# Patient Record
Sex: Female | Born: 1985 | Race: Black or African American | Hispanic: No | Smoking: Never smoker
Health system: Southern US, Community
[De-identification: ages and names within clinical notes are randomized; demographics above are authoritative.]

## PROBLEM LIST (undated history)

## (undated) DIAGNOSIS — J45909 Unspecified asthma, uncomplicated: Secondary | ICD-10-CM

## (undated) HISTORY — DX: Unspecified asthma, uncomplicated: J45.909

## (undated) HISTORY — PX: WISDOM TOOTH EXTRACTION: SHX21

---
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
## 2007-09-19 ENCOUNTER — Emergency Department (HOSPITAL_COMMUNITY): Admission: EM | Admit: 2007-09-19 | Discharge: 2007-09-19 | Payer: Self-pay | Admitting: Emergency Medicine

## 2009-01-30 ENCOUNTER — Emergency Department (HOSPITAL_COMMUNITY): Admission: EM | Admit: 2009-01-30 | Discharge: 2009-01-30 | Payer: Self-pay | Admitting: Family Medicine

## 2009-01-30 ENCOUNTER — Emergency Department (HOSPITAL_COMMUNITY): Admission: EM | Admit: 2009-01-30 | Discharge: 2009-01-31 | Payer: Self-pay | Admitting: Emergency Medicine

## 2009-10-05 ENCOUNTER — Emergency Department (HOSPITAL_COMMUNITY): Admission: EM | Admit: 2009-10-05 | Discharge: 2009-10-05 | Payer: Self-pay | Admitting: Family Medicine

## 2009-10-29 ENCOUNTER — Emergency Department (HOSPITAL_COMMUNITY): Admission: EM | Admit: 2009-10-29 | Discharge: 2009-10-30 | Payer: Self-pay | Admitting: Emergency Medicine

## 2010-06-24 LAB — COMPREHENSIVE METABOLIC PANEL
ALT: 35 U/L (ref 0–35)
AST: 34 U/L (ref 0–37)
Alkaline Phosphatase: 52 U/L (ref 39–117)
CO2: 25 mEq/L (ref 19–32)
Chloride: 106 mEq/L (ref 96–112)
Creatinine, Ser: 0.74 mg/dL (ref 0.4–1.2)
GFR calc Af Amer: >60 mL/min (ref >60)
GFR calc non Af Amer: >60 mL/min (ref >60)
Potassium: 3.6 mEq/L (ref 3.5–5.1)
Sodium: 138 mEq/L (ref 135–145)
Total Bilirubin: 0.5 mg/dL (ref 0.3–1.2)

## 2010-06-24 LAB — CBC
Hemoglobin: 13.3 g/dL (ref 12.0–15.0)
MCH: 32.2 pg (ref 26.0–34.0)
RBC: 4.12 MIL/uL (ref 3.87–5.11)
WBC: 9 10*3/uL (ref 4.0–10.5)

## 2010-06-24 LAB — LIPASE, BLOOD: Lipase: 24 U/L (ref 11–59)

## 2010-06-24 LAB — DIFFERENTIAL
Basophils Absolute: 0 10*3/uL (ref 0.0–0.1)
Basophils Relative: 0 % (ref 0–1)
Eosinophils Absolute: 0.1 10*3/uL (ref 0.0–0.7)
Eosinophils Relative: 1 % (ref 0–5)
Monocytes Absolute: 0.4 10*3/uL (ref 0.1–1.0)

## 2010-06-24 LAB — URINALYSIS, ROUTINE W REFLEX MICROSCOPIC
Bilirubin Urine: NEGATIVE
Hgb urine dipstick: NEGATIVE
Ketones, ur: 40 mg/dL — AB
Nitrite: NEGATIVE
Urobilinogen, UA: 1 mg/dL (ref 0.0–1.0)

## 2010-06-24 LAB — WET PREP, GENITAL
Clue Cells Wet Prep HPF POC: NONE SEEN
Yeast Wet Prep HPF POC: NONE SEEN

## 2010-06-24 LAB — URINE MICROSCOPIC-ADD ON

## 2010-06-24 LAB — POCT PREGNANCY, URINE: Preg Test, Ur: NEGATIVE

## 2010-06-25 LAB — URINALYSIS, MICROSCOPIC ONLY
Glucose, UA: NEGATIVE mg/dL
Hgb urine dipstick: NEGATIVE
Specific Gravity, Urine: 1.028 (ref 1.005–1.030)
pH: 5.5 (ref 5.0–8.0)

## 2010-06-25 LAB — CBC
Hemoglobin: 13.9 g/dL (ref 12.0–15.0)
MCH: 32.7 pg (ref 26.0–34.0)
MCHC: 34.5 g/dL (ref 30.0–36.0)
Platelets: 296 10*3/uL (ref 150–400)
RDW: 12.7 % (ref 11.5–15.5)

## 2010-06-25 LAB — POCT URINALYSIS DIP (DEVICE)
Ketones, ur: >=160 mg/dL — AB
Nitrite: NEGATIVE
Protein, ur: 30 mg/dL — AB
pH: 5.5 (ref 5.0–8.0)

## 2010-06-25 LAB — POCT PREGNANCY, URINE: Preg Test, Ur: NEGATIVE

## 2010-06-25 LAB — URINE CULTURE: Colony Count: 25000

## 2010-06-25 LAB — WET PREP, GENITAL
Trich, Wet Prep: NONE SEEN
Yeast Wet Prep HPF POC: NONE SEEN

## 2010-06-25 LAB — GC/CHLAMYDIA PROBE AMP, GENITAL
Chlamydia, DNA Probe: POSITIVE — AB
GC Probe Amp, Genital: NEGATIVE

## 2010-06-25 LAB — COMPREHENSIVE METABOLIC PANEL
AST: 18 U/L (ref 0–37)
CO2: 27 mEq/L (ref 19–32)
Calcium: 9.3 mg/dL (ref 8.4–10.5)
Creatinine, Ser: 0.8 mg/dL (ref 0.4–1.2)
GFR calc Af Amer: >60 mL/min (ref >60)
GFR calc non Af Amer: >60 mL/min (ref >60)
Sodium: 135 mEq/L (ref 135–145)
Total Protein: 8.6 g/dL — ABNORMAL HIGH (ref 6.0–8.3)

## 2010-08-25 NOTE — Op Note (Signed)
NAMECHLOE, Alexandria Rogers                ACCOUNT NO.:  [id number]   MEDICAL RECORD NO.:  [id number]          PATIENT TYPE:  INP   LOCATION:  9120                          FACILITY:  [facility]   PHYSICIAN:  Ginger Carne, MD  DATE OF BIRTH:  16-Nov-1985   DATE OF PROCEDURE:  08/02/2006  DATE OF DISCHARGE:                               OPERATIVE REPORT   PREOPERATIVE DIAGNOSES:  1. Intrauterine pregnancy at term.  2. Arrest of descent.  3. Nonreassuring fetal heart tracings.   POSTOPERATIVE DIAGNOSES:  1. Intrauterine pregnancy at term.  2. Arrest of descent.  3. Nonreassuring fetal heart tracings.   PROCEDURE:  Primary low transverse cesarean section.   SURGEON:  Ginger Carne, M.D.   ASSISTANT:  Paticia Stack, M.D.   ANESTHESIA:  Epidural.   SPECIMEN:  Placenta sent to labor and delivery.   ESTIMATED BLOOD LOSS:  700 mL.   COMPLICATIONS:  None.   FINDINGS:  Viable female infant, Apgars 8 and 9 at one and five minutes,  respectively.  Cord pH 7.20. Please see delivery record for birth  weight.   INDICATIONS FOR PROCEDURE:  This is a 25 year old gravida 1 who  presented with spontaneous onset of labor.  Was augmented with Pitocin  and progressed to an anterior lip with a occasional variables.  Started  developing late decelerations that were present with pushing. The  patient was taken back for primary cesarean section secondary to  nonreassuring fetal heart tracings.   PROCEDURE:  The patient was taken to the operating room, where her  epidural anesthesia was found to be adequate.  She was then prepped and  draped in a normal sterile fashion in the dorsal supine position with a  leftward tilt.  The Pfannenstiel skin incision was then made with a  scalpel and carried through to the underlying layer of fascia.  The  fascia was incised in the midline and the incision extended laterally  with the Mayo scissors.  The superior aspect of the fascial incision was  then grasped  with Kocher clamps, elevated, and the underlying rectus  muscles dissected off bluntly.  Attention was then turned to the  inferior aspect of this incision, which in a similar fashion was  grasped, tented up with the Kocher clamps, and the rectus muscles  dissected off bluntly.  The rectus muscles were then separated in the  midline and the peritoneum identified, tented up, and entered sharply  with the Metzenbaum scissors.  The peritoneal incision was then extended  superiorly and inferiorly, with good visualization of the bladder. The  right and left rectus muscles were partially transected to allow more  room for delivery of the infant. The bladder blade was inserted, and the  vesicouterine peritoneum identified, grasped with pickups, and entered  sharply with the Metzenbaum scissors.  This incision was then extended  laterally and the bladder flap created digitally.  The bladder blade was  then reinserted, and the lower uterine segment incised in a transverse  fashion with a scalpel. The bladder blade was removed, and the infant's  head was found  to be deep in the maternal pelvis in the ROP  presentation. The nose and mouth were suctioned and the cord clamped and  cut.  Infant with spontaneous vigorous cry.  The infant was handed off  to the awaiting pediatricians.  Cord gases were obtained.  The placenta  was then removed manually.  The uterus was exteriorized and cleared of  all clots and debris.  The uterine incision was repaired with 0 Vicryl  in a running-locked fashion.  The uterine incision was reinspected  several times, with excellent hemostasis noted.  A second suture of the  same 0 Vicryl was used, with two figure-of-eights to obtain excellent  hemostasis. The gutters were cleared of all clots. The uterine incision  was once again reinspected, with excellent hemostasis noted.  The  muscles were hemostatic.  The fascia was reapproximated with 0 Vicryl in  a running-locked  fashion.  The skin was closed with staples.  The  patient tolerated the procedure well.  Sponge, lap, and needle counts  were correct x2.  The patient was taken to the recovery room in stable  condition.     ______________________________  Paticia Stack, MD      Ginger Carne, MD  Electronically Signed    LNJ/MEDQ  D:  08/02/2006  T:  08/02/2006  Job:  [id number]

## 2010-08-25 NOTE — Discharge Summary (Signed)
NAMEVERNEAL, WIERS                ACCOUNT NO.:  [id number]   MEDICAL RECORD NO.:  [id number]          PATIENT TYPE:  INP   LOCATION:  9159                          FACILITY:  [facility]   PHYSICIAN:  Tanya S. Shawnie Pons, M.D.   DATE OF BIRTH:  03/04/1986   DATE OF ADMISSION:  05/01/2006  DATE OF DISCHARGE:  05/02/2006                               DISCHARGE SUMMARY   ADMISSION DIAGNOSES:  1. Intrauterine pregnancy at [redacted] weeks gestation.  2. Gastroenteritis.  3. Dehydration.  4. Hypokalemia.   DISCHARGE DIAGNOSES:  1. Intrauterine pregnancy at 28 weeks, 1 day.  2. Viral gastroenteritis.  3. Dehydration.  4. Hypokalemia.   LABORATORY DATA:  Pertinent labs include a potassium of 3.1, AST 22, ALT  16, amylase 84, lipase 26. Urinalysis with specific gravity greater than  1.030, 40 ketones, negative nitrites, trace leukocyte esterase.  White  blood cell count 15.5, hemoglobin 12.9, platelet count 216,000.  Studies  showed an abdominal ultrasound done showing no evidence of gallstones or  biliary ductal dilatation, essentially negative abdominal ultrasound.   REASON FOR ADMISSION:  This is an 25 year old gravida 1 at [redacted] weeks  gestation who presented with a two week history of diarrhea, nausea and  vomiting x1 day.  Patient also with diffuse abdominal pain, decreased  urine output and inability to tolerate p.o. intake.  Patient was  admitted for 23-hour observation.   HOSPITAL COURSE:  The patient was admitted for observation, was started  on IV hydration with potassium supplementation, also on Phenergan and  Zofran for her nausea.  She was given Pepcid for her heartburn.  An  abdominal ultrasound was obtained secondary to patient complaining of  epigastric discomfort with nausea and vomiting.  The ultrasound was  negative for gallstones.  The patient improved on the IV hydration and  antiemetics and the following day felt remarkably better, able to  tolerate her diet and was discharged home  in stable condition.   DISCHARGE MEDICATIONS:  1. Phenergan 12.5 mg q.6h. p.r.n. nausea.  2. Prenatal vitamins.  3. Pepcid 20 mg b.i.d.   FOLLOWUP:  [facility] at 9:15 A.M.  She was given a note to return to work on  May 06, 2006.     ______________________________  Paticia Stack, MD    ______________________________  Shelbie Proctor. Shawnie Pons, M.D.    LNJ/MEDQ  D:  05/02/2006  T:  05/02/2006  Job:  [id number]

## 2010-08-25 NOTE — Discharge Summary (Signed)
[name], Alexandria Rogers                ACCOUNT NO.:  [id number]   MEDICAL RECORD NO.:  [id number]          PATIENT TYPE:  INP   LOCATION:  9120                          [facility]ACILITY:  [facility]   PHYSICIAN:  Tracy L. Mayford Knife, M.D.DATE OF BIRTH:  [birth year]   DATE OF ADMISSION:  08/01/2006  DATE OF DISCHARGE:  08/05/2006                               DISCHARGE SUMMARY   DISCHARGE DIAGNOSES:  1. Status post primary low transverse cesarean section for non-      reassuring fetal heart tones at 101 weeks' gestational age.  2. Anemia secondary to acute blood loss, stable.   PERTINENT LABORATORY DATA:  Admit hemoglobin 12.5, discharge hemoglobin  10.4.  B positive, antibody negative.  GBS positive.  HIV nonreactive.   HOSPITAL COURSE:  [age]-year-old G1 at 41 weeks presented complaining  of contractions.  Initial MFE revealed that she was dilated to 4 cm, 70%  effaced and -3 station.  The patient was admitted, got her epidural, had  artificial rupture of membranes and Pitocin.  The patient progressed to  complete and -1 to 0 station.  The patient started having severe  decelerations with pushing, so the patient was taken for section; please  see operative note for full details.  The baby's Apgars were 8 and 9  with a cord pH of 7.2.   Postoperative course was unremarkable.  The patient was voiding,  ambulating and tolerating p.o.  Lactation was consulted secondary to her  having difficulty with breast-feeding.  Overall, she was felt stable for  discharge on postoperative day #3.   DISPOSITION:  Home.   FO[facility]UP[facility]   DISCHARGE MEDICATIONS:  1. Micronor one tablet p.o. daily, start in 2 weeks.  The patient was      advised that she needs to change over to her regular birth control      pill when she stops breast-feeding.  2. Percocet 5 mg one to two p.o. q.4-6 h. p.r.Alexandria.  3. Motrin 600 mg one tablet p.o. q.6 h. p.r.[place]and[place].  4. Prenatal vitamins one p.o. daily.         ______________________________  Marc Morgans Mayford Knife, M.D.     TLW/MEDQ  D:  08/05/2006  T:  08/05/2006  Job:  [id number]

## 2012-12-24 ENCOUNTER — Ambulatory Visit (INDEPENDENT_AMBULATORY_CARE_PROVIDER_SITE_OTHER): Payer: BC Managed Care – PPO | Admitting: Obstetrics

## 2012-12-24 ENCOUNTER — Encounter: Payer: Self-pay | Admitting: Obstetrics

## 2012-12-24 VITALS — BP 121/84 | HR 66 | Temp 99.3°F | Ht 67.0 in | Wt 216.0 lb

## 2012-12-24 DIAGNOSIS — J45990 Exercise induced bronchospasm: Secondary | ICD-10-CM

## 2012-12-24 DIAGNOSIS — Z3009 Encounter for other general counseling and advice on contraception: Secondary | ICD-10-CM

## 2012-12-24 DIAGNOSIS — J45909 Unspecified asthma, uncomplicated: Secondary | ICD-10-CM | POA: Insufficient documentation

## 2012-12-24 DIAGNOSIS — Z01419 Encounter for gynecological examination (general) (routine) without abnormal findings: Secondary | ICD-10-CM

## 2012-12-24 HISTORY — DX: Exercise induced bronchospasm: J45.990

## 2012-12-24 MED ORDER — ALBUTEROL SULFATE HFA 108 (90 BASE) MCG/ACT IN AERS
2.0000 | INHALATION_SPRAY | Freq: Four times a day (QID) | RESPIRATORY_TRACT | Status: DC | PRN
Start: 2012-12-24 — End: 2013-01-16

## 2012-12-24 MED ORDER — NORGESTREL-ETHINYL ESTRADIOL 0.3-30 MG-MCG PO TABS: 1.0000 | ORAL_TABLET | Freq: Every day | ORAL | Status: DC

## 2012-12-24 NOTE — Progress Notes (Signed)
Subjective:     Alexandria Rogers is a 27 y.o. female here for a routine exam.  Current complaints: patient is in the office for annual exam- and Mirena removal.Patient states she has had it for over 5 years and it is time to come out. Patient would like to use the birth control pills.  Personal health questionnaire reviewed: no.   Gynecologic History No LMP recorded. Patient is not currently having periods (Reason: IUD). Contraception: IUD Last Pap: over 1 year. Results were: normal   Obstetric History OB History  No data available     The following portions of the patient's history were reviewed and updated as appropriate: allergies, current medications, past family history, past medical history, past social history, past surgical history and problem list.  Review of Systems Pertinent items are noted in HPI.    Objective:    General appearance: alert and no distress Breasts: normal appearance, no masses or tenderness Abdomen: normal findings: soft, non-tender Pelvic: cervix normal in appearance, external genitalia normal, no adnexal masses or tenderness, no cervical motion tenderness, rectovaginal septum normal, uterus normal size, shape, and consistency, vagina normal without discharge and vagina with thin, grey discharge. Extremities: extremities normal, atraumatic, no cyanosis or edema    Assessment:    Healthy female exam.    Plan:    Education reviewed: safe sex/STD prevention. Contraception: IUD. Follow up in: 1 year.

## 2012-12-25 LAB — GC/CHLAMYDIA PROBE AMP: GC Probe RNA: NEGATIVE

## 2012-12-25 LAB — PAP IG W/ RFLX HPV ASCU

## 2012-12-25 LAB — WET PREP BY MOLECULAR PROBE
Candida species: NEGATIVE
Trichomonas vaginosis: NEGATIVE

## 2012-12-31 ENCOUNTER — Encounter: Payer: Self-pay | Admitting: Obstetrics

## 2012-12-31 ENCOUNTER — Ambulatory Visit (INDEPENDENT_AMBULATORY_CARE_PROVIDER_SITE_OTHER): Payer: BC Managed Care – PPO | Admitting: Obstetrics

## 2012-12-31 VITALS — Temp 98.4°F | Ht 67.0 in | Wt 215.4 lb

## 2012-12-31 DIAGNOSIS — Z30431 Encounter for routine checking of intrauterine contraceptive device: Secondary | ICD-10-CM

## 2012-12-31 DIAGNOSIS — Z30432 Encounter for removal of intrauterine contraceptive device: Secondary | ICD-10-CM

## 2012-12-31 NOTE — Progress Notes (Signed)
.   Subjective:     Alexandria Rogers is a 27 y.o. female here for her IUD removal.  She had it inserted 5 years ago.  Personal health questionnaire reviewed: yes.   Gynecologic History No LMP recorded. Patient is not currently having periods (Reason: IUD). Contraception: none   Obstetric History OB History  No data available     The following portions of the patient's history were reviewed and updated as appropriate: allergies, current medications, past family history, past medical history, past social history, past surgical history and problem list.  Review of Systems Pertinent items are noted in HPI.    Objective:    General appearance: alert and no distress Pelvic: cervix normal in appearance, external genitalia normal, no adnexal masses or tenderness, no cervical motion tenderness, uterus normal size, shape, and consistency, vagina normal without discharge and IUD string visible, grasped with dressing forcep and removed, intact.    Assessment:    Healthy female exam.    Plan:    Contraception: OCP (estrogen/progesterone). F/U prn.

## 2013-01-01 ENCOUNTER — Encounter: Payer: Self-pay | Admitting: Obstetrics & Gynecology

## 2013-01-16 ENCOUNTER — Encounter (HOSPITAL_COMMUNITY): Payer: Self-pay | Admitting: Emergency Medicine

## 2013-01-16 ENCOUNTER — Emergency Department (HOSPITAL_COMMUNITY)
Admission: EM | Admit: 2013-01-16 | Discharge: 2013-01-16 | Disposition: A | Discharge status: Home / Self Care | Payer: BC Managed Care – PPO | Attending: Emergency Medicine | Admitting: Emergency Medicine

## 2013-01-16 DIAGNOSIS — R197 Diarrhea, unspecified: Secondary | ICD-10-CM

## 2013-01-16 DIAGNOSIS — R1110 Vomiting, unspecified: Secondary | ICD-10-CM

## 2013-01-16 DIAGNOSIS — R112 Nausea with vomiting, unspecified: Secondary | ICD-10-CM | POA: Insufficient documentation

## 2013-01-16 DIAGNOSIS — Z79899 Other long term (current) drug therapy: Secondary | ICD-10-CM | POA: Insufficient documentation

## 2013-01-16 DIAGNOSIS — G43909 Migraine, unspecified, not intractable, without status migrainosus: Secondary | ICD-10-CM | POA: Insufficient documentation

## 2013-01-16 DIAGNOSIS — J45909 Unspecified asthma, uncomplicated: Secondary | ICD-10-CM | POA: Insufficient documentation

## 2013-01-16 DIAGNOSIS — Z3202 Encounter for pregnancy test, result negative: Secondary | ICD-10-CM | POA: Insufficient documentation

## 2013-01-16 LAB — URINALYSIS, DIPSTICK ONLY
Glucose, UA: NEGATIVE mg/dL
Protein, ur: 30 mg/dL — AB

## 2013-01-16 LAB — COMPREHENSIVE METABOLIC PANEL
CO2: 23 mEq/L (ref 19–32)
Calcium: 8.7 mg/dL (ref 8.4–10.5)
Creatinine, Ser: 0.84 mg/dL (ref 0.50–1.10)
GFR calc Af Amer: >90 mL/min (ref >90)
GFR calc non Af Amer: >90 mL/min (ref >90)
Glucose, Bld: 84 mg/dL (ref 70–99)

## 2013-01-16 LAB — CBC WITH DIFFERENTIAL/PLATELET
Eosinophils Relative: 2 % (ref 0–5)
HCT: 37.9 % (ref 36.0–46.0)
Lymphocytes Relative: 18 % (ref 12–46)
Lymphs Abs: 1.3 10*3/uL (ref 0.7–4.0)
MCV: 91.3 fL (ref 78.0–100.0)
Monocytes Absolute: 0.8 10*3/uL (ref 0.1–1.0)
Monocytes Relative: 12 % (ref 3–12)
RBC: 4.15 MIL/uL (ref 3.87–5.11)
WBC: 7 10*3/uL (ref 4.0–10.5)

## 2013-01-16 LAB — PREGNANCY, URINE: Preg Test, Ur: NEGATIVE

## 2013-01-16 MED ORDER — LOPERAMIDE HCL 2 MG PO CAPS: 2.0000 mg | ORAL_CAPSULE | Freq: Four times a day (QID) | ORAL | Status: DC | PRN

## 2013-01-16 MED ORDER — ONDANSETRON HCL 4 MG PO TABS: 4.0000 mg | ORAL_TABLET | Freq: Four times a day (QID) | ORAL | Status: DC

## 2013-01-16 MED ORDER — SODIUM CHLORIDE 0.9 % IV BOLUS (SEPSIS)
1000.0000 mL | Freq: Once | INTRAVENOUS | Status: AC
  Administered 2013-01-16: 1000 mL via INTRAVENOUS

## 2013-01-16 MED ORDER — METOCLOPRAMIDE HCL 5 MG/ML IJ SOLN
5.0000 mg | Freq: Once | INTRAMUSCULAR | Status: AC
  Administered 2013-01-16: 5 mg via INTRAVENOUS
  Filled 2013-01-16: qty 2

## 2013-01-16 MED ORDER — DIPHENHYDRAMINE HCL 50 MG/ML IJ SOLN
12.5000 mg | Freq: Once | INTRAMUSCULAR | Status: AC
  Administered 2013-01-16: 12.5 mg via INTRAVENOUS
  Filled 2013-01-16: qty 1

## 2013-01-16 MED ORDER — ACETAMINOPHEN 325 MG PO TABS
650.0000 mg | ORAL_TABLET | Freq: Once | ORAL | Status: AC
  Administered 2013-01-16: 650 mg via ORAL
  Filled 2013-01-16: qty 2

## 2013-01-16 NOTE — ED Notes (Signed)
Pt reports for past 3 days she has been having n/v abd pain and headaches. States that she has not been able to keep anything down. Denies any urinary symptoms but reports the pain is worse after she eats.

## 2013-01-16 NOTE — ED Provider Notes (Signed)
CSN: [id number]     Arrival date & time 01/16/13  7829 History   First MD Initiated Contact with Patient 01/16/13 1003     Chief Complaint  Patient presents with  . Abdominal Pain  . Nausea  . Emesis  . Migraine   (Consider location/radiation/quality/duration/timing/severity/associated sxs/prior Treatment) HPI Comments: 27 yo female with pmh of asthma present with 3 day h/o n/v/d.  Pt denies f/c, f/u/d, vaginal d/c or bleeding.  No cp, sob.    ABD pain is isolated to epigastric area.  No pelvic pain or suprapubic pain.  LMP is irregular.  Pt is taking OCP and had IUD removed 2 weeks ago.    No recent travel.  Pt's brother is sick with similar symptoms.  Pt denies toxin exposure or eating spoiled foods.    Patient is a 27 y.o. female presenting with abdominal pain, vomiting, and migraines. The history is provided by the patient.  Abdominal Pain Pain location:  Epigastric Pain quality: aching   Pain quality: not bloating, not heavy and no pressure   Pain radiates to:  Does not radiate Pain severity:  Mild Onset quality:  Gradual Duration:  3 days Timing:  Intermittent Progression:  Waxing and waning Chronicity:  New Context: eating   Context: not alcohol use, not diet changes, not previous surgeries, not recent illness and not recent sexual activity   Relieved by:  None tried Worsened by:  Nothing tried Associated symptoms: diarrhea, nausea and vomiting   Associated symptoms: no constipation   Emesis Associated symptoms: abdominal pain, diarrhea and headaches   Migraine Associated symptoms include abdominal pain and headaches.    Past Medical History  Diagnosis Date  . Asthma    Past Surgical History  Procedure Laterality Date  . Cesarean section     Family History  Problem Relation Age of Onset  . Heart disease Father   . Alcohol abuse Father   . Diabetes Paternal Grandmother    History  Substance Use Topics  . Smoking status: Light Tobacco Smoker  .  Smokeless tobacco: Not on file  . Alcohol Use: Yes     Comment: once or twice a week   OB History   Grav Para Term Preterm Abortions TAB SAB Ect Mult Living                 Review of Systems  Constitutional: Negative.   Eyes: Negative.   Respiratory: Negative.   Cardiovascular: Negative.   Gastrointestinal: Positive for nausea, vomiting, abdominal pain and diarrhea. Negative for constipation, blood in stool, anal bleeding and rectal pain.  Endocrine: Negative.   Genitourinary: Negative.   Musculoskeletal: Negative.   Skin: Negative.   Neurological: Positive for headaches. Negative for dizziness, seizures, facial asymmetry, speech difficulty, light-headedness and numbness.       Mild HA.  Not worst, not first, no trauma, no neuro deficit.      Allergies  Shrimp  Home Medications   Current Outpatient Rx  Name  Route  Sig  Dispense  Refill  . albuterol (PROVENTIL HFA;VENTOLIN HFA) 108 (90 BASE) MCG/ACT inhaler   Inhalation   Inhale 2 puffs into the lungs every 6 (six) hours as needed for wheezing.         . Aspirin-Acetaminophen-Caffeine (GOODY HEADACHE PO)   Oral   Take 1 packet by mouth once as needed (for headache).         . norgestrel-ethinyl estradiol (LO/OVRAL) 0.3-30 MG-MCG tablet   Oral   Take 1  tablet by mouth daily.   1 Package   11    BP 115/69  Pulse 64  Temp(Src) 97.9 F (36.6 C) (Oral)  Resp 18  Wt 215 lb (97.523 kg)  BMI 33.67 kg/m2  SpO2 100% Physical Exam  Constitutional: She is oriented to person, place, and time. She appears well-developed and well-nourished.  HENT:  Head: Normocephalic and atraumatic.  Mouth/Throat: No oropharyngeal exudate.  Eyes: Conjunctivae are normal. Right eye exhibits no discharge. Left eye exhibits no discharge.  Neck: Normal range of motion. Neck supple.  Cardiovascular: Normal rate and regular rhythm.   Pulmonary/Chest: Effort normal and breath sounds normal. No respiratory distress. She has no wheezes. She  has no rales. She exhibits no tenderness.  Abdominal: Soft. Bowel sounds are normal. She exhibits no distension and no mass. There is hepatomegaly. There is no hepatosplenomegaly or splenomegaly. There is tenderness. There is no rigidity, no rebound, no guarding, no CVA tenderness, no tenderness at McBurney's point and negative Murphy's sign. No hernia. Hernia confirmed negative in the ventral area.    Genitourinary:  No pelvic symptoms, no lower abd ttp or pain; pt declined pelvic exam.    Musculoskeletal: Normal range of motion. She exhibits no edema and no tenderness.  Neurological: She is alert and oriented to person, place, and time. She has normal reflexes.  Skin: Skin is warm and dry.    ED Course  Procedures (including critical care time) Labs Review Results for orders placed during the hospital encounter of 01/16/13  URINALYSIS, DIPSTICK ONLY      Result Value Range   Specific Gravity, Urine 1.038 (*) 1.005 - 1.030   pH 6.0  5.0 - 8.0   Glucose, UA NEGATIVE  NEGATIVE mg/dL   Hgb urine dipstick SMALL (*) NEGATIVE   Bilirubin Urine SMALL (*) NEGATIVE   Ketones, ur 15 (*) NEGATIVE mg/dL   Protein, ur 30 (*) NEGATIVE mg/dL   Urobilinogen, UA 1.0  0.0 - 1.0 mg/dL   Nitrite NEGATIVE  NEGATIVE   Leukocytes, UA NEGATIVE  NEGATIVE  CBC WITH DIFFERENTIAL      Result Value Range   WBC 7.0  4.0 - 10.5 K/uL   RBC 4.15  3.87 - 5.11 MIL/uL   Hemoglobin 13.3  12.0 - 15.0 g/dL   HCT 86.5  78.4 - 69.6 %   MCV 91.3  78.0 - 100.0 fL   MCH 32.0  26.0 - 34.0 pg   MCHC 35.1  30.0 - 36.0 g/dL   RDW 29.5  28.4 - 13.2 %   Platelets 199  150 - 400 K/uL   Neutrophils Relative % 68  43 - 77 %   Neutro Abs 4.7  1.7 - 7.7 K/uL   Lymphocytes Relative 18  12 - 46 %   Lymphs Abs 1.3  0.7 - 4.0 K/uL   Monocytes Relative 12  3 - 12 %   Monocytes Absolute 0.8  0.1 - 1.0 K/uL   Eosinophils Relative 2  0 - 5 %   Eosinophils Absolute 0.1  0.0 - 0.7 K/uL   Basophils Relative 0  0 - 1 %   Basophils  Absolute 0.0  0.0 - 0.1 K/uL  COMPREHENSIVE METABOLIC PANEL      Result Value Range   Sodium 139  135 - 145 mEq/L   Potassium 3.6  3.5 - 5.1 mEq/L   Chloride 104  96 - 112 mEq/L   CO2 23  19 - 32 mEq/L   Glucose, Bld 84  70 - 99 mg/dL   BUN 9  6 - 23 mg/dL   Creatinine, Ser 4.09  0.50 - 1.10 mg/dL   Calcium 8.7  8.4 - 81.1 mg/dL   Total Protein 7.3  6.0 - 8.3 g/dL   Albumin 3.4 (*) 3.5 - 5.2 g/dL   AST 18  0 - 37 U/L   ALT 15  0 - 35 U/L   Alkaline Phosphatase 48  39 - 117 U/L   Total Bilirubin 0.2 (*) 0.3 - 1.2 mg/dL   GFR calc non Af Amer >90  >90 mL/min   GFR calc Af Amer >90  >90 mL/min  LIPASE, BLOOD      Result Value Range   Lipase 23  11 - 59 U/L  PREGNANCY, URINE      Result Value Range   Preg Test, Ur NEGATIVE  NEGATIVE     MDM  No diagnosis found. 27 yo female with n/v/d and mild HA.  VSS.  ER w/u negative.  Doubt appy, SBI, uti, pyelo, surgical abdomen, ectopic, PID, pancreatitis, ICH, meningitis.  Suspect AGE with dehydration which liely caused the pt's HA.  AFVSS, benign abdomen.  No pelvic complaints or lower abd pain therefore no pelvic exam.  Pt sx free on d/c.  Will provide zofran and imodium.  ER precautions given.  F/U PCM prn.  Pt agrees with plan.    Darlys Gales, MD 01/16/13 [phone]

## 2014-01-17 ENCOUNTER — Other Ambulatory Visit: Payer: Self-pay | Admitting: Obstetrics

## 2014-02-18 ENCOUNTER — Other Ambulatory Visit: Payer: Self-pay | Admitting: Obstetrics

## 2014-03-16 ENCOUNTER — Other Ambulatory Visit: Payer: Self-pay | Admitting: Obstetrics

## 2014-03-25 ENCOUNTER — Other Ambulatory Visit: Payer: Self-pay | Admitting: *Deleted

## 2014-03-25 DIAGNOSIS — Z3041 Encounter for surveillance of contraceptive pills: Secondary | ICD-10-CM

## 2014-03-25 MED ORDER — NORGESTREL-ETHINYL ESTRADIOL 0.3-30 MG-MCG PO TABS: 1.0000 | ORAL_TABLET | Freq: Every day | ORAL | Status: DC

## 2014-03-25 NOTE — Progress Notes (Signed)
Patient placed a call to the office requesting a refill on her birth control. Patient is due an annual exam and has been scheduled for 05-10-14. Patient given refills to last until that appointment time. Patient verbalized understanding and advised prescription has been sent to the pharmacy.

## 2014-05-10 ENCOUNTER — Ambulatory Visit: Payer: Self-pay | Admitting: Obstetrics

## 2020-03-25 LAB — OB RESULTS CONSOLE RPR: RPR: NONREACTIVE

## 2020-03-25 LAB — OB RESULTS CONSOLE PLATELET COUNT: Platelets: 253

## 2020-03-25 LAB — OB RESULTS CONSOLE HIV ANTIBODY (ROUTINE TESTING): HIV: NONREACTIVE

## 2020-03-25 LAB — OB RESULTS CONSOLE GC/CHLAMYDIA
Chlamydia: NEGATIVE
Gonorrhea: NEGATIVE

## 2020-03-25 LAB — OB RESULTS CONSOLE ANTIBODY SCREEN: Antibody Screen: NEGATIVE

## 2020-03-25 LAB — OB RESULTS CONSOLE HGB/HCT, BLOOD
HCT: 40 (ref 29–41)
Hemoglobin: 13.6

## 2020-03-25 LAB — OB RESULTS CONSOLE RUBELLA ANTIBODY, IGM: Rubella: IMMUNE

## 2020-03-25 LAB — OB RESULTS CONSOLE ABO/RH: RH Type: POSITIVE

## 2020-03-25 LAB — OB RESULTS CONSOLE HEPATITIS B SURFACE ANTIGEN: Hepatitis B Surface Ag: NEGATIVE

## 2020-07-14 ENCOUNTER — Ambulatory Visit (INDEPENDENT_AMBULATORY_CARE_PROVIDER_SITE_OTHER): Payer: 59 | Admitting: Family Medicine

## 2020-07-14 ENCOUNTER — Encounter: Payer: Self-pay | Admitting: Family Medicine

## 2020-07-14 ENCOUNTER — Other Ambulatory Visit: Payer: Self-pay

## 2020-07-14 DIAGNOSIS — O0990 Supervision of high risk pregnancy, unspecified, unspecified trimester: Secondary | ICD-10-CM

## 2020-07-14 DIAGNOSIS — Z98891 History of uterine scar from previous surgery: Secondary | ICD-10-CM | POA: Insufficient documentation

## 2020-07-14 DIAGNOSIS — O34219 Maternal care for unspecified type scar from previous cesarean delivery: Secondary | ICD-10-CM

## 2020-07-14 DIAGNOSIS — J45990 Exercise induced bronchospasm: Secondary | ICD-10-CM

## 2020-07-14 DIAGNOSIS — Z8632 Personal history of gestational diabetes: Secondary | ICD-10-CM

## 2020-07-14 NOTE — Patient Instructions (Signed)
 Breastfeeding  Choosing to breastfeed is one of the best decisions you can make for yourself and your baby. A change in hormones during pregnancy causes your breasts to make breast milk in your milk-producing glands. Hormones prevent breast milk from being released before your baby is born. They also prompt milk flow after birth. Once breastfeeding has begun, thoughts of your baby, as well as his or her sucking or crying, can stimulate the release of milk from your milk-producing glands. Benefits of breastfeeding Research shows that breastfeeding offers many health benefits for infants and mothers. It also offers a cost-free and convenient way to feed your baby. For your baby  Your first milk (colostrum) helps your baby's digestive system to function better.  Special cells in your milk (antibodies) help your baby to fight off infections.  Breastfed babies are less likely to develop asthma, allergies, obesity, or type 2 diabetes. They are also at lower risk for sudden infant death syndrome (SIDS).  Nutrients in breast milk are better able to meet your baby's needs compared to infant formula.  Breast milk improves your baby's brain development. For you  Breastfeeding helps to create a very special bond between you and your baby.  Breastfeeding is convenient. Breast milk costs nothing and is always available at the correct temperature.  Breastfeeding helps to burn calories. It helps you to lose the weight that you gained during pregnancy.  Breastfeeding makes your uterus return faster to its size before pregnancy. It also slows bleeding (lochia) after you give birth.  Breastfeeding helps to lower your risk of developing type 2 diabetes, osteoporosis, rheumatoid arthritis, cardiovascular disease, and breast, ovarian, uterine, and endometrial cancer later in life. Breastfeeding basics Starting breastfeeding  Find a comfortable place to sit or lie down, with your neck and back  well-supported.  Place a pillow or a rolled-up blanket under your baby to bring him or her to the level of your breast (if you are seated). Nursing pillows are specially designed to help support your arms and your baby while you breastfeed.  Make sure that your baby's tummy (abdomen) is facing your abdomen.  Gently massage your breast. With your fingertips, massage from the outer edges of your breast inward toward the nipple. This encourages milk flow. If your milk flows slowly, you may need to continue this action during the feeding.  Support your breast with 4 fingers underneath and your thumb above your nipple (make the letter "C" with your hand). Make sure your fingers are well away from your nipple and your baby's mouth.  Stroke your baby's lips gently with your finger or nipple.  When your baby's mouth is open wide enough, quickly bring your baby to your breast, placing your entire nipple and as much of the areola as possible into your baby's mouth. The areola is the colored area around your nipple. ? More areola should be visible above your baby's upper lip than below the lower lip. ? Your baby's lips should be opened and extended outward (flanged) to ensure an adequate, comfortable latch. ? Your baby's tongue should be between his or her lower gum and your breast.  Make sure that your baby's mouth is correctly positioned around your nipple (latched). Your baby's lips should create a seal on your breast and be turned out (everted).  It is common for your baby to suck about 2-3 minutes in order to start the flow of breast milk. Latching Teaching your baby how to latch onto your breast properly   is very important. An improper latch can cause nipple pain, decreased milk supply, and poor weight gain in your baby. Also, if your baby is not latched onto your nipple properly, he or she may swallow some air during feeding. This can make your baby fussy. Burping your baby when you switch breasts  during the feeding can help to get rid of the air. However, teaching your baby to latch on properly is still the best way to prevent fussiness from swallowing air while breastfeeding. Signs that your baby has successfully latched onto your nipple  Silent tugging or silent sucking, without causing you pain. Infant's lips should be extended outward (flanged).  Swallowing heard between every 3-4 sucks once your milk has started to flow (after your let-down milk reflex occurs).  Muscle movement above and in front of his or her ears while sucking. Signs that your baby has not successfully latched onto your nipple  Sucking sounds or smacking sounds from your baby while breastfeeding.  Nipple pain. If you think your baby has not latched on correctly, slip your finger into the corner of your baby's mouth to break the suction and place it between your baby's gums. Attempt to start breastfeeding again. Signs of successful breastfeeding Signs from your baby  Your baby will gradually decrease the number of sucks or will completely stop sucking.  Your baby will fall asleep.  Your baby's body will relax.  Your baby will retain a small amount of milk in his or her mouth.  Your baby will let go of your breast by himself or herself. Signs from you  Breasts that have increased in firmness, weight, and size 1-3 hours after feeding.  Breasts that are softer immediately after breastfeeding.  Increased milk volume, as well as a change in milk consistency and color by the fifth day of breastfeeding.  Nipples that are not sore, cracked, or bleeding. Signs that your baby is getting enough milk  Wetting at least 1-2 diapers during the first 24 hours after birth.  Wetting at least 5-6 diapers every 24 hours for the first week after birth. The urine should be clear or pale yellow by the age of 5 days.  Wetting 6-8 diapers every 24 hours as your baby continues to grow and develop.  At least 3 stools in  a 24-hour period by the age of 5 days. The stool should be soft and yellow.  At least 3 stools in a 24-hour period by the age of 7 days. The stool should be seedy and yellow.  No loss of weight greater than 10% of birth weight during the first 3 days of life.  Average weight gain of 4-7 oz (113-198 g) per week after the age of 4 days.  Consistent daily weight gain by the age of 5 days, without weight loss after the age of 2 weeks. After a feeding, your baby may spit up a small amount of milk. This is normal. Breastfeeding frequency and duration Frequent feeding will help you make more milk and can prevent sore nipples and extremely full breasts (breast engorgement). Breastfeed when you feel the need to reduce the fullness of your breasts or when your baby shows signs of hunger. This is called "breastfeeding on demand." Signs that your baby is hungry include:  Increased alertness, activity, or restlessness.  Movement of the head from side to side.  Opening of the mouth when the corner of the mouth or cheek is stroked (rooting).  Increased sucking sounds, smacking lips,   cooing, sighing, or squeaking.  Hand-to-mouth movements and sucking on fingers or hands.  Fussing or crying. Avoid introducing a pacifier to your baby in the first 4-6 weeks after your baby is born. After this time, you may choose to use a pacifier. Research has shown that pacifier use during the first year of a baby's life decreases the risk of sudden infant death syndrome (SIDS). Allow your baby to feed on each breast as long as he or she wants. When your baby unlatches or falls asleep while feeding from the first breast, offer the second breast. Because newborns are often sleepy in the first few weeks of life, you may need to awaken your baby to get him or her to feed. Breastfeeding times will vary from baby to baby. However, the following rules can serve as a guide to help you make sure that your baby is properly  fed:  Newborns (babies 4 weeks of age or younger) may breastfeed every 1-3 hours.  Newborns should not go without breastfeeding for longer than 3 hours during the day or 5 hours during the night.  You should breastfeed your baby a minimum of 8 times in a 24-hour period. Breast milk pumping Pumping and storing breast milk allows you to make sure that your baby is exclusively fed your breast milk, even at times when you are unable to breastfeed. This is especially important if you go back to work while you are still breastfeeding, or if you are not able to be present during feedings. Your lactation consultant can help you find a method of pumping that works best for you and give you guidelines about how long it is safe to store breast milk.      Caring for your breasts while you breastfeed Nipples can become dry, cracked, and sore while breastfeeding. The following recommendations can help keep your breasts moisturized and healthy:  Avoid using soap on your nipples.  Wear a supportive bra designed especially for nursing. Avoid wearing underwire-style bras or extremely tight bras (sports bras).  Air-dry your nipples for 3-4 minutes after each feeding.  Use only cotton bra pads to absorb leaked breast milk. Leaking of breast milk between feedings is normal.  Use lanolin on your nipples after breastfeeding. Lanolin helps to maintain your skin's normal moisture barrier. Pure lanolin is not harmful (not toxic) to your baby. You may also hand express a few drops of breast milk and gently massage that milk into your nipples and allow the milk to air-dry. In the first few weeks after giving birth, some women experience breast engorgement. Engorgement can make your breasts feel heavy, warm, and tender to the touch. Engorgement peaks within 3-5 days after you give birth. The following recommendations can help to ease engorgement:  Completely empty your breasts while breastfeeding or pumping. You may  want to start by applying warm, moist heat (in the shower or with warm, water-soaked hand towels) just before feeding or pumping. This increases circulation and helps the milk flow. If your baby does not completely empty your breasts while breastfeeding, pump any extra milk after he or she is finished.  Apply ice packs to your breasts immediately after breastfeeding or pumping, unless this is too uncomfortable for you. To do this: ? Put ice in a plastic bag. ? Place a towel between your skin and the bag. ? Leave the ice on for 20 minutes, 2-3 times a day.  Make sure that your baby is latched on and positioned properly while   breastfeeding. If engorgement persists after 48 hours of following these recommendations, contact your health care provider or a Advertising copywriter. Overall health care recommendations while breastfeeding  Eat 3 healthy meals and 3 snacks every day. Well-nourished mothers who are breastfeeding need an additional 450-500 calories a day. You can meet this requirement by increasing the amount of a balanced diet that you eat.  Drink enough water to keep your urine pale yellow or clear.  Rest often, relax, and continue to take your prenatal vitamins to prevent fatigue, stress, and low vitamin and mineral levels in your body (nutrient deficiencies).  Do not use any products that contain nicotine or tobacco, such as cigarettes and e-cigarettes. Your baby may be harmed by chemicals from cigarettes that pass into breast milk and exposure to secondhand smoke. If you need help quitting, ask your health care provider.  Avoid alcohol.  Do not use illegal drugs or marijuana.  Talk with your health care provider before taking any medicines. These include over-the-counter and prescription medicines as well as vitamins and herbal supplements. Some medicines that may be harmful to your baby can pass through breast milk.  It is possible to become pregnant while breastfeeding. If birth  control is desired, ask your health care provider about options that will be safe while breastfeeding your baby. Where to find more information: [employer]: www.llli.org Contact a health care provider if:  You feel like you want to stop breastfeeding or have become frustrated with breastfeeding.  Your nipples are cracked or bleeding.  Your breasts are red, tender, or warm.  You have: ? Painful breasts or nipples. ? A swollen area on either breast. ? A fever or chills. ? Nausea or vomiting. ? Drainage other than breast milk from your nipples.  Your breasts do not become full before feedings by the fifth day after you give birth.  You feel sad and depressed.  Your baby is: ? Too sleepy to eat well. ? Having trouble sleeping. ? More than 97 week old and wetting fewer than 6 diapers in a 24-hour period. ? Not gaining weight by 24 days of age.  Your baby has fewer than 3 stools in a 24-hour period.  Your baby's skin or the white parts of his or her eyes become yellow. Get help right away if:  Your baby is overly tired (lethargic) and does not want to wake up and feed.  Your baby develops an unexplained fever. Summary  Breastfeeding offers many health benefits for infant and mothers.  Try to breastfeed your infant when he or she shows early signs of hunger.  Gently tickle or stroke your baby's lips with your finger or nipple to allow the baby to open his or her mouth. Bring the baby to your breast. Make sure that much of the areola is in your baby's mouth. Offer one side and burp the baby before you offer the other side.  Talk with your health care provider or lactation consultant if you have questions or you face problems as you breastfeed. This information is not intended to replace advice given to you by your health care provider. Make sure you discuss any questions you have with your health care provider. Document Revised: 06/20/2017 Document Reviewed:  04/27/2016 Elsevier Patient Education  2021 Elsevier Inc.   Preparing for Vaginal Birth After Cesarean Delivery Vaginal birth after cesarean delivery (VBAC) is giving birth vaginally after previously delivering a baby through a cesarean section (C-section). You and your health are  provider will discuss your options and whether you may be a good candidate for VBAC. What are my options? After a cesarean delivery, your options for future deliveries may include:  Scheduled repeat cesarean delivery. This is done in a hospital with an operating room.  Trial of labor after cesarean (TOLAC). A successful TOLAC results in a vaginal delivery. If it is not successful, you will need to have a cesarean delivery. TOLAC should be attempted in facilities where an emergency cesarean delivery can be performed. It should not be done as a home birth. Talk with your health care provider about the risks and benefits of each option early in your pregnancy. The best option for you will depend on your preferences and your overall health as well as your baby's. What should I know about my past cesarean delivery? It is important to know what type of incision was made in your uterus in a past cesarean delivery. The type of incision can affect the success of your TOLAC. Types of incisions include:  Low transverse. This is a side-to-side cut low on your uterus. The scar on your skin looks like a horizontal line just above your pubic area. This type of cut is the most common and makes you a good candidate for TOLAC.  Low vertical. This is an up-and-down cut low on your uterus. The scar on your skin looks like a vertical line between your pubic area and belly button. This type of cut puts you at higher risk for problems during TOLAC.  High vertical or classical. This is an up-and-down cut high on your uterus. The scar on your skin looks like a vertical line that runs over the top of your belly button. This type of cut has the  highest risk for problems and usually means that TOLAC is not an option.   When is VBAC not an option? As you progress through your pregnancy, circumstances may change and you may need to reconsider your options. Your situation may also change even as you begin TOLAC. Your health care provider may not want you to attempt a VBAC if you:  Need to have labor started (induced) because your cervix is not ready for labor.  Have never had a vaginal delivery.  Have had more than two cesarean deliveries.  Are overdue.  Are pregnant with a very large baby.  Have a condition that causes high blood pressure (preeclampsia). Questions to ask your health care provider  Am I a good candidate for TOLAC?  What are my chances of a successful vaginal delivery?  Is my preferred birth location equipped for a TOLAC?  What are my pain management options during a TOLAC? Where to find more information  American [employer] of Obstetricians and Gynecologists: www.acog.org  [employer]-Midwives: www.midwife.org Summary  Vaginal birth after cesarean delivery (VBAC) is giving birth vaginally after previously delivering a baby through a cesarean section (C-section).  VBAC may be a safe and appropriate option for you depending on your medical history and other risk factors. Talk with your health care provider about the options available to you, and the risks and benefits of each early in your pregnancy.  TOLAC should be attempted in facilities where emergency cesarean section procedures can be performed. This information is not intended to replace advice given to you by your health care provider. Make sure you discuss any questions you have with your health care provider. Document Revised: 07/22/2018 Document Reviewed: 07/05/2016 Elsevier Patient Education  2021 [employer].

## 2020-07-14 NOTE — Progress Notes (Signed)
Last pap smear Aug 2021

## 2020-07-15 DIAGNOSIS — Z8632 Personal history of gestational diabetes: Secondary | ICD-10-CM | POA: Insufficient documentation

## 2020-07-15 NOTE — Progress Notes (Signed)
   PRENATAL VISIT NOTE  Subjective:  Alexandria Rogers is a 35 y.o. G2P1001 at [redacted]w[redacted]d being seen today for transferring prenatal care from [place]. Works from home so moved back for less busy city  She is currently monitored for the following issues for this high-risk pregnancy and has Exercise-induced asthma; Supervision of high risk pregnancy, antepartum; and Previous cesarean delivery affecting pregnancy, antepartum on their problem list.  Patient reports no complaints.  Contractions: Not present. Vag. Bleeding: None.  Movement: Present. Denies leaking of fluid.   The following portions of the patient's history were reviewed and updated as appropriate: allergies, current medications, past family history, past medical history, past social history, past surgical history and problem list.   Objective:   Vitals:   07/14/20 1446  BP: 114/76  Pulse: 73  Weight: 265 lb 9.6 oz (120.5 kg)    Fetal Status:     Movement: Present     General:  Alert, oriented and cooperative. Patient is in no acute distress.  Skin: Skin is warm and dry. No rash noted.   Cardiovascular: Normal heart rate noted  Respiratory: Normal respiratory effort, no problems with respiration noted  Abdomen: Soft, gravid, appropriate for gestational age.  Pain/Pressure: Present     Pelvic: Cervical exam deferred        Extremities: Normal range of motion.     Mental Status: Normal mood and affect. Normal behavior. Normal judgment and thought content.   Assessment and Plan:  Pregnancy: G2P1001 at [redacted]w[redacted]d 1. Supervision of high risk pregnancy, antepartum States has had anatomy u/s--may need f/u--will await records Declined genetics  2. Previous cesarean delivery affecting pregnancy, antepartum Discussed R/B/A of RCS and TOLAC--she is considering options.  3. Exercise-induced asthma Has inhaler if needed.  General obstetric precautions including but not limited to vaginal bleeding, contractions, leaking of fluid and fetal  movement were reviewed in detail with the patient. Please refer to After Visit Summary for other counseling recommendations.   Return in 4 weeks (on 08/11/2020).  Future Appointments  Date Time Provider Department Center  08/11/2020  9:30 AM [place] Bing, MD [facility]    Reva Bores, MD

## 2020-07-20 ENCOUNTER — Encounter: Payer: Self-pay | Admitting: Family Medicine

## 2020-07-20 ENCOUNTER — Encounter: Payer: Self-pay | Admitting: Radiology

## 2020-08-11 ENCOUNTER — Other Ambulatory Visit: Payer: Self-pay

## 2020-08-11 ENCOUNTER — Ambulatory Visit (INDEPENDENT_AMBULATORY_CARE_PROVIDER_SITE_OTHER): Payer: 59 | Admitting: Obstetrics and Gynecology

## 2020-08-11 VITALS — BP 116/77 | HR 76 | Wt 268.6 lb

## 2020-08-11 DIAGNOSIS — O34219 Maternal care for unspecified type scar from previous cesarean delivery: Secondary | ICD-10-CM

## 2020-08-11 DIAGNOSIS — Z3A26 26 weeks gestation of pregnancy: Secondary | ICD-10-CM

## 2020-08-11 DIAGNOSIS — O0990 Supervision of high risk pregnancy, unspecified, unspecified trimester: Secondary | ICD-10-CM

## 2020-08-11 NOTE — Progress Notes (Signed)
   PRENATAL VISIT NOTE  Subjective:  Alexandria Rogers is a 35 y.o. G2P1001 at [redacted]w[redacted]d being seen today for ongoing prenatal care.  She is currently monitored for the following issues for this high-risk pregnancy and has Exercise-induced asthma; Supervision of high risk pregnancy, antepartum; Previous cesarean delivery affecting pregnancy, antepartum; and History of gestational diabetes on their problem list.  Patient reports no complaints.  Contractions: Not present. Vag. Bleeding: None.  Movement: Present. Denies leaking of fluid.   The following portions of the patient's history were reviewed and updated as appropriate: allergies, current medications, past family history, past medical history, past social history, past surgical history and problem list.   Objective:   Vitals:   08/11/20 0930  BP: 116/77  Pulse: 76  Weight: 268 lb 9.6 oz (121.8 kg)    Fetal Status: Fetal Heart Rate (bpm): 141   Movement: Present     General:  Alert, oriented and cooperative. Patient is in no acute distress.  Skin: Skin is warm and dry. No rash noted.   Cardiovascular: Normal heart rate noted  Respiratory: Normal respiratory effort, no problems with respiration noted  Abdomen: Soft, gravid, appropriate for gestational age.  Pain/Pressure: Present     Pelvic: Cervical exam deferred        Extremities: Normal range of motion.     Mental Status: Normal mood and affect. Normal behavior. Normal judgment and thought content.   Assessment and Plan:  Pregnancy: G2P1001 at [redacted]w[redacted]d 1. Supervision of high risk pregnancy, antepartum Routine care. 2h GTT nv. [facility] records reviewed and she needs completion anatomy u/s, which patient confirms she moved before she could get this done. Will set up with mfm here - [place] [facility] OB DETAIL +14 WK; Future  2. [redacted] weeks gestation of pregnancy  3. Previous cesarean delivery affecting pregnancy, antepartum D/w her re: this and patient undecided. I d/w her re: risk of uterine rupture  of about 1% and maternal fetal risks with this and increased risk of another failed trial of labor. I told her if she's considering a tolac that I would recommend a growth u/s around 36-38wks  Preterm labor symptoms and general obstetric precautions including but not limited to vaginal bleeding, contractions, leaking of fluid and fetal movement were reviewed in detail with the patient. Please refer to After Visit Summary for other counseling recommendations.   Return in about 2 weeks (around 08/25/2020) for in person, 2hr GTT, low risk, md or app.  Future Appointments  Date Time Provider Department Center  08/25/2020 11:30 AM Anyanwu, Jethro Bastos, MD [facility]  09/01/2020  8:00 AM [facility] NURSE [facility]  09/01/2020  8:15 AM [facility] US2 [facility]    [place] Bing, MD

## 2020-08-25 ENCOUNTER — Encounter: Payer: Self-pay | Admitting: Obstetrics & Gynecology

## 2020-08-25 ENCOUNTER — Ambulatory Visit (INDEPENDENT_AMBULATORY_CARE_PROVIDER_SITE_OTHER): Payer: 59 | Admitting: Obstetrics & Gynecology

## 2020-08-25 ENCOUNTER — Other Ambulatory Visit: Payer: Self-pay

## 2020-08-25 VITALS — BP 119/75 | HR 82 | Wt 269.0 lb

## 2020-08-25 DIAGNOSIS — Z3A28 28 weeks gestation of pregnancy: Secondary | ICD-10-CM

## 2020-08-25 DIAGNOSIS — O0990 Supervision of high risk pregnancy, unspecified, unspecified trimester: Secondary | ICD-10-CM

## 2020-08-25 DIAGNOSIS — O99210 Obesity complicating pregnancy, unspecified trimester: Secondary | ICD-10-CM

## 2020-08-25 DIAGNOSIS — O99212 Obesity complicating pregnancy, second trimester: Secondary | ICD-10-CM

## 2020-08-25 DIAGNOSIS — Z23 Encounter for immunization: Secondary | ICD-10-CM | POA: Diagnosis not present

## 2020-08-25 DIAGNOSIS — O34219 Maternal care for unspecified type scar from previous cesarean delivery: Secondary | ICD-10-CM

## 2020-08-25 DIAGNOSIS — O0992 Supervision of high risk pregnancy, unspecified, second trimester: Secondary | ICD-10-CM

## 2020-08-25 NOTE — Progress Notes (Signed)
   PRENATAL VISIT NOTE  Subjective:  Alexandria Rogers is a 35 y.o. G2P1001 at [redacted]w[redacted]d being seen today for ongoing prenatal care.  She is currently monitored for the following issues for this high-risk pregnancy and has Exercise-induced asthma; Supervision of high risk pregnancy, antepartum; and Previous cesarean delivery affecting pregnancy, antepartum on their problem list.  Patient reports no complaints.  Contractions: Not present. Vag. Bleeding: None.  Movement: Present. Denies leaking of fluid.   The following portions of the patient's history were reviewed and updated as appropriate: allergies, current medications, past family history, past medical history, past social history, past surgical history and problem list.   Objective:   Vitals:   08/25/20 0922  BP: 119/75  Pulse: 82  Weight: 269 lb (122 kg)    Fetal Status: Fetal Heart Rate (bpm): 133   Movement: Present     General:  Alert, oriented and cooperative. Patient is in no acute distress.  Skin: Skin is warm and dry. No rash noted.   Cardiovascular: Normal heart rate noted  Respiratory: Normal respiratory effort, no problems with respiration noted  Abdomen: Soft, gravid, appropriate for gestational age.  Pain/Pressure: Present     Pelvic: Cervical exam deferred        Extremities: Normal range of motion.  Edema: Trace  Mental Status: Normal mood and affect. Normal behavior. Normal judgment and thought content.   Assessment and Plan:  Pregnancy: G2P1001 at [redacted]w[redacted]d 1. Previous cesarean delivery affecting pregnancy, antepartum Counseled regarding TOLAC vs RCS; risks/benefits discussed in detail. All questions answered.  Patient elects for TOLAC as long as her baby is not too big, consent signed 08/25/2020.  2. Obesity in pregnancy, antepartum TWG 16, doing well so far. Growth/anatomy scan scheduled on 09/01/20.  3. [redacted] weeks gestation of pregnancy 4. Supervision of high risk pregnancy, antepartum Third trimester labs today. Tdap  given.  - Glucose Tolerance, 2 Hours w/1 Hour - CBC - RPR - HIV Antibody (routine testing w rflx) - HCV Ab w Reflex to Quant PCR - Tdap vaccine greater than or equal to 7yo IM Preterm labor symptoms and general obstetric precautions including but not limited to vaginal bleeding, contractions, leaking of fluid and fetal movement were reviewed in detail with the patient. Please refer to After Visit Summary for other counseling recommendations.   Return in about 2 weeks (around 09/08/2020) for OFFICE OB VISIT (MD only).  Future Appointments  Date Time Provider Department Center  08/25/2020 11:30 AM [place], Jethro Bastos, MD [facility]  09/01/2020  8:00 AM [facility] NURSE [facility]  09/01/2020  8:15 AM [facility] US2 [facility]    Jaynie Collins, MD

## 2020-08-25 NOTE — Patient Instructions (Addendum)
Vaginal Birth After Cesarean Delivery  Vaginal birth after cesarean delivery (VBAC) is giving birth vaginally after previously delivering a baby through a cesarean section (C-section). A VBAC may be a safe option for you, depending on your health and other factors. It is important to discuss VBAC with your health care provider early in your pregnancy so you can understand the risks, benefits, and options. Having these discussions early will give you time to make your birth plan. Who are the best candidates for VBAC? The best candidates for VBAC are women who:  Have had one or two prior cesarean deliveries, and the incision made during the delivery was horizontal (low transverse).  Do not have a vertical (classical) scar on their uterus.  Have not had a tear in the wall of their uterus (uterine rupture).  Plan to have more pregnancies. A VBAC is also more likely to be successful:  In women who have previously given birth vaginally.  When labor starts by itself (spontaneously) before the due date. What are the benefits of VBAC? The benefits of delivering your baby vaginally instead of by a cesarean delivery include:  A shorter hospital stay.  A faster recovery time.  Less pain.  Avoiding risks associated with major surgery, such as infection and blood clots.  Less blood loss and less need for donated blood (transfusions). What are the risks of VBAC? The main risk of attempting a VBAC is that it may fail, forcing your health care provider to deliver your baby by a C-section. Other risks are rare and include:  Tearing (rupture) of the scar from a past cesarean delivery.  Other risks associated with vaginal deliveries. If a repeat cesarean delivery is needed, the risks include:  Blood loss.  Infection.  Blood clot.  Damage to surrounding organs.  Removal of the uterus (hysterectomy), if it is damaged.  Placenta problems in future pregnancies. What else should I know  about my options? Delivering a baby through a VBAC is similar to having a normal spontaneous vaginal delivery. Therefore, it is safe:  To try with twins.  For your health care provider to try to turn the baby from a breech position (external cephalic version) during labor.  With epidural analgesia for pain relief. Consider where you would like to deliver your baby. VBAC should be attempted in facilities where an emergency cesarean delivery can be performed. VBAC is not recommended for home births. Any changes in your health or your baby's health during your pregnancy may make it necessary to change your initial decision about VBAC. Your health care provider may recommend that you do not attempt a VBAC if:  Your baby's suspected weight is 8.8 lb (4 kg) or more.  You have preeclampsia. This is a condition that causes high blood pressure along with other symptoms, such as swelling and headaches.  You will have VBAC less than 19 months after your cesarean delivery.  You are past your due date.  You need to have labor started (induced) because your cervix is not ready for labor (unfavorable). Where to find more information  American Pregnancy Association: americanpregnancy.org  American Congress of Obstetricians and Gynecologists: acog.org Summary  Vaginal birth after cesarean delivery (VBAC) is giving birth vaginally after previously delivering a baby through a cesarean section (C-section). A VBAC may be a safe option for you, depending on your health and other factors.  Discuss VBAC with your health care provider early in your pregnancy so you can understand the risks, benefits, options, and   have plenty of time to make your birth plan.  The main risk of attempting a VBAC is that it may fail, forcing your health care provider to deliver your baby by a C-section. Other risks are rare. This information is not intended to replace advice given to you by your health care provider. Make sure  you discuss any questions you have with your health care provider. Document Revised: 07/22/2018 Document Reviewed: 07/03/2016 Elsevier Patient Education  2021 [employer].  Third Trimester of Pregnancy  The third trimester of pregnancy is from week 28 through week 40. This is months 7 through 9. The third trimester is a time when the unborn baby (fetus) is growing rapidly. At the end of the ninth month, the fetus is about 20 inches long and weighs 6-10 pounds. Body changes during your third trimester During the third trimester, your body will continue to go through many changes. The changes vary and generally return to normal after your baby is born. Physical changes  Your weight will continue to increase. You can expect to gain 25-35 pounds (11-16 kg) by the end of the pregnancy if you begin pregnancy at a normal weight. If you are underweight, you can expect to gain 28-40 lb (about 13-18 kg), and if you are overweight, you can expect to gain 15-25 lb (about 7-11 kg).  You may begin to get stretch marks on your hips, abdomen, and breasts.  Your breasts will continue to grow and may hurt. A yellow fluid (colostrum) may leak from your breasts. This is the first milk you are producing for your baby.  You may have changes in your hair. These can include thickening of your hair, rapid growth, and changes in texture. Some people also have hair loss during or after pregnancy, or hair that feels dry or thin.  Your belly button may stick out.  You may notice more swelling in your hands, face, or ankles. Health changes  You may have heartburn.  You may have constipation.  You may develop hemorrhoids.  You may develop swollen, bulging veins (varicose veins) in your legs.  You may have increased body aches in the pelvis, back, or thighs. This is due to weight gain and increased hormones that are relaxing your joints.  You may have increased tingling or numbness in your hands, arms, and legs.  The skin on your abdomen may also feel numb.  You may feel short of breath because of your expanding uterus. Other changes  You may urinate more often because the fetus is moving lower into your pelvis and pressing on your bladder.  You may have more problems sleeping. This may be caused by the size of your abdomen, an increased need to urinate, and an increase in your body's metabolism.  You may notice the fetus "dropping," or moving lower in your abdomen (lightening).  You may have increased vaginal discharge.  You may notice that you have pain around your pelvic bone as your uterus distends. Follow these instructions at home: Medicines  Follow your health care provider's instructions regarding medicine use. Specific medicines may be either safe or unsafe to take during pregnancy. Do not take any medicines unless approved by your health care provider.  Take a prenatal vitamin that contains at least 600 micrograms (mcg) of folic acid. Eating and drinking  Eat a healthy diet that includes fresh fruits and vegetables, whole grains, good sources of protein such as meat, eggs, or tofu, and low-fat dairy products.  Avoid raw meat  and unpasteurized juice, milk, and cheese. These carry germs that can harm you and your baby.  Eat 4 or 5 small meals rather than 3 large meals a day.  You may need to take these actions to prevent or treat constipation: ? Drink enough fluid to keep your urine pale yellow. ? Eat foods that are high in fiber, such as beans, whole grains, and fresh fruits and vegetables. ? Limit foods that are high in fat and processed sugars, such as fried or sweet foods. Activity  Exercise only as directed by your health care provider. Most people can continue their usual exercise routine during pregnancy. Try to exercise for 30 minutes at least 5 days a week. Stop exercising if you experience contractions in the uterus.  Stop exercising if you develop pain or cramping in the  lower abdomen or lower back.  Avoid heavy lifting.  Do not exercise if it is very hot or humid or if you are at a high altitude.  If you choose to, you may continue to have sex unless your health care provider tells you not to. Relieving pain and discomfort  Take frequent breaks and rest with your legs raised (elevated) if you have leg cramps or low back pain.  Take warm sitz baths to soothe any pain or discomfort caused by hemorrhoids. Use hemorrhoid cream if your health care provider approves.  Wear a supportive bra to prevent discomfort from breast tenderness.  If you develop varicose veins: ? Wear support hose as told by your health care provider. ? Elevate your feet for 15 minutes, 3-4 times a day. ? Limit salt in your diet. Safety  Talk to your health care provider before traveling far distances.  Do not use hot tubs, steam rooms, or saunas.  Wear your seat belt at all times when driving or riding in a car.  Talk with your health care provider if someone is verbally or physically abusive to you. Preparing for birth To prepare for the arrival of your baby:  Take prenatal classes to understand, practice, and ask questions about labor and delivery.  Visit the hospital and tour the maternity area.  Purchase a rear-facing car seat and make sure you know how to install it in your car.  Prepare the baby's room or sleeping area. Make sure to remove all pillows and stuffed animals from the baby's crib to prevent suffocation. General instructions  Avoid cat litter boxes and soil used by cats. These carry germs that can cause birth defects in the baby. If you have a cat, ask someone to clean the litter box for you.  Do not douche or use tampons. Do not use scented sanitary pads.  Do not use any products that contain nicotine or tobacco, such as cigarettes, e-cigarettes, and chewing tobacco. If you need help quitting, ask your health care provider.  Do not use any herbal  remedies, illegal drugs, or medicines that were not prescribed to you. Chemicals in these products can harm your baby.  Do not drink alcohol.  You will have more frequent prenatal exams during the third trimester. During a routine prenatal visit, your health care provider will do a physical exam, perform tests, and discuss your overall health. Keep all follow-up visits. This is important. Where to find more information  American Pregnancy Association: americanpregnancy.org  [employer] of Obstetricians and Gynecologists: https://www.todd-brady.net/  Office on [employer] Health: MightyReward[employer].nz Contact a health care provider if you have:  A fever.  Mild pelvic cramps, pelvic  pressure, or nagging pain in your abdominal area or lower back.  Vomiting or diarrhea.  Bad-smelling vaginal discharge or foul-smelling urine.  Pain when you urinate.  A headache that does not go away when you take medicine.  Visual changes or see spots in front of your eyes. Get help right away if:  Your water breaks.  You have regular contractions less than 5 minutes apart.  You have spotting or bleeding from your vagina.  You have severe abdominal pain.  You have difficulty breathing.  You have chest pain.  You have fainting spells.  You have not felt your baby move for the time period told by your health care provider.  You have new or increased pain, swelling, or redness in an arm or leg. Summary  The third trimester of pregnancy is from week 28 through week 40 (months 7 through 9).  You may have more problems sleeping. This can be caused by the size of your abdomen, an increased need to urinate, and an increase in your body's metabolism.  You will have more frequent prenatal exams during the third trimester. Keep all follow-up visits. This is important. This information is not intended to replace advice given to you by your health care provider. Make sure you  discuss any questions you have with your health care provider. Document Revised: 09/02/2019 Document Reviewed: 07/09/2019 Elsevier Patient Education  2021 [employer].

## 2020-08-26 LAB — HCV INTERPRETATION

## 2020-08-26 LAB — CBC
Hematocrit: 36.1 % (ref 34.0–46.6)
Hemoglobin: 12.2 g/dL (ref 11.1–15.9)
MCH: 31.4 pg (ref 26.6–33.0)
MCHC: 33.8 g/dL (ref 31.5–35.7)
MCV: 93 fL (ref 79–97)
Platelets: 250 10*3/uL (ref 150–450)
RBC: 3.88 x10E6/uL (ref 3.77–5.28)
RDW: 13.1 % (ref 11.7–15.4)
WBC: 10.3 10*3/uL (ref 3.4–10.8)

## 2020-08-26 LAB — GLUCOSE TOLERANCE, 2 HOURS W/ 1HR
Glucose, 1 hour: 115 mg/dL (ref 65–179)
Glucose, 2 hour: 104 mg/dL (ref 65–152)
Glucose, Fasting: 79 mg/dL (ref 65–91)

## 2020-08-26 LAB — RPR: RPR Ser Ql: NONREACTIVE

## 2020-08-26 LAB — HCV AB W REFLEX TO QUANT PCR: HCV Ab: <0.1 s/co ratio (ref 0.0–0.9)

## 2020-08-26 LAB — HIV ANTIBODY (ROUTINE TESTING W REFLEX): HIV Screen 4th Generation wRfx: NONREACTIVE

## 2020-09-01 ENCOUNTER — Other Ambulatory Visit: Payer: Self-pay

## 2020-09-01 ENCOUNTER — Other Ambulatory Visit: Payer: Self-pay | Admitting: Obstetrics and Gynecology

## 2020-09-01 ENCOUNTER — Ambulatory Visit: Payer: 59 | Attending: Obstetrics and Gynecology

## 2020-09-01 ENCOUNTER — Ambulatory Visit: Payer: 59 | Admitting: *Deleted

## 2020-09-01 ENCOUNTER — Encounter: Payer: Self-pay | Admitting: *Deleted

## 2020-09-01 DIAGNOSIS — O34219 Maternal care for unspecified type scar from previous cesarean delivery: Secondary | ICD-10-CM | POA: Diagnosis present

## 2020-09-01 DIAGNOSIS — O0990 Supervision of high risk pregnancy, unspecified, unspecified trimester: Secondary | ICD-10-CM | POA: Insufficient documentation

## 2020-09-01 DIAGNOSIS — O99213 Obesity complicating pregnancy, third trimester: Secondary | ICD-10-CM

## 2020-09-01 IMAGING — US US MFM OB DETAIL+14 WK
1 series · 13 of 28 positions shown · non-contrast
Comparison: none

[Series 1: us mfm ob detail+14 wk · 110 acquisitions, 13 frames shown]
[im 5/110]
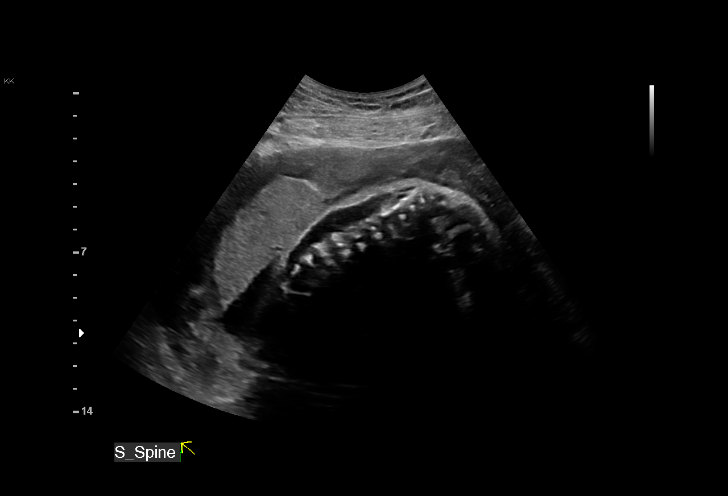
[im 13/110]
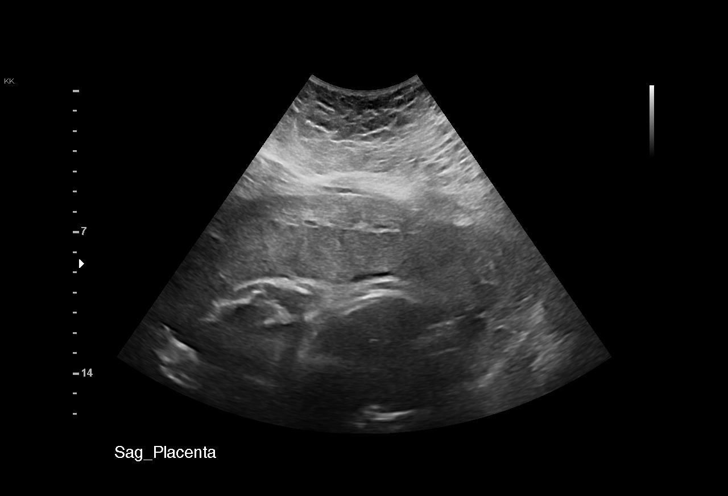
[im 21/110]
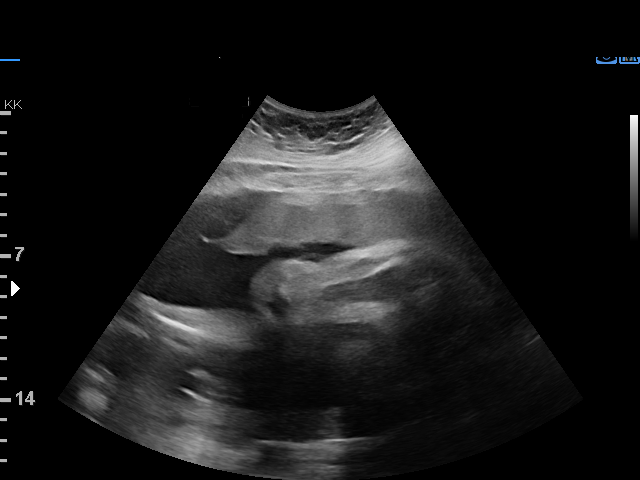
[im 29/110]
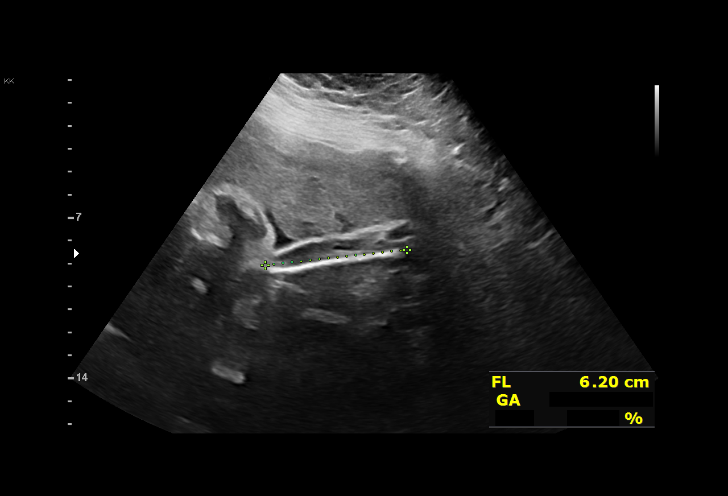
[im 37/110]
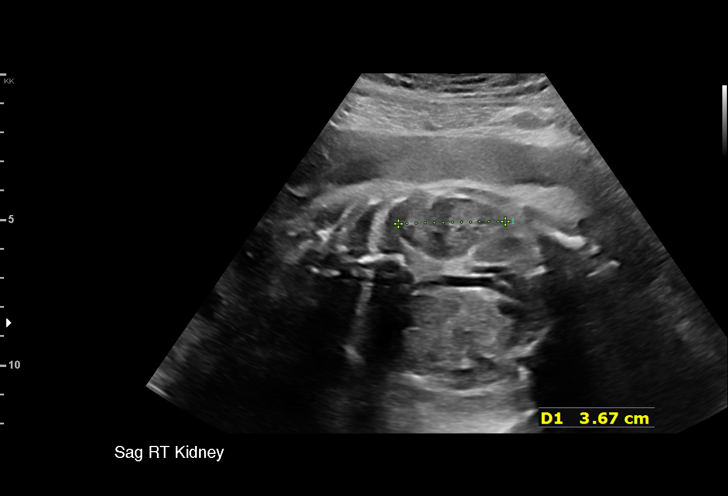
[im 45/110]
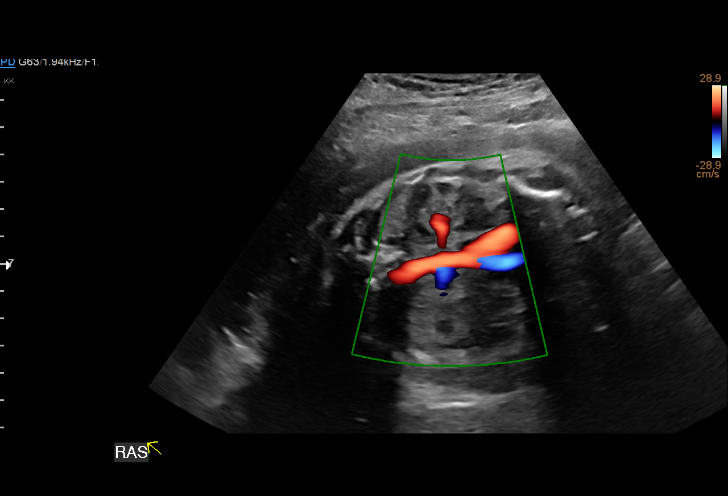
[im 57/110]
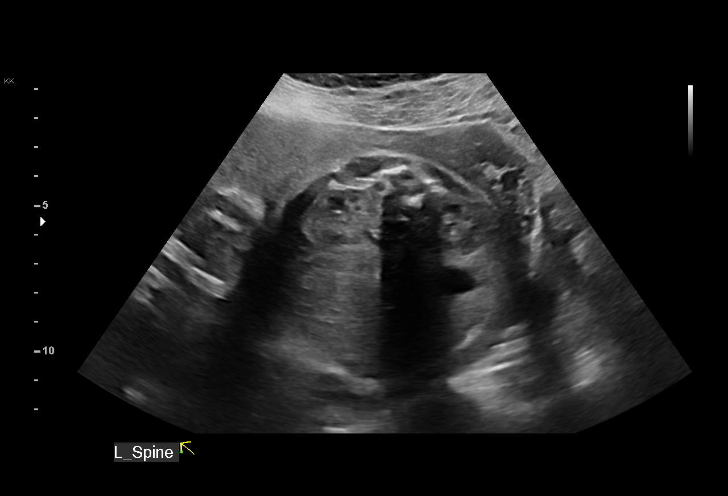
[im 65/110]
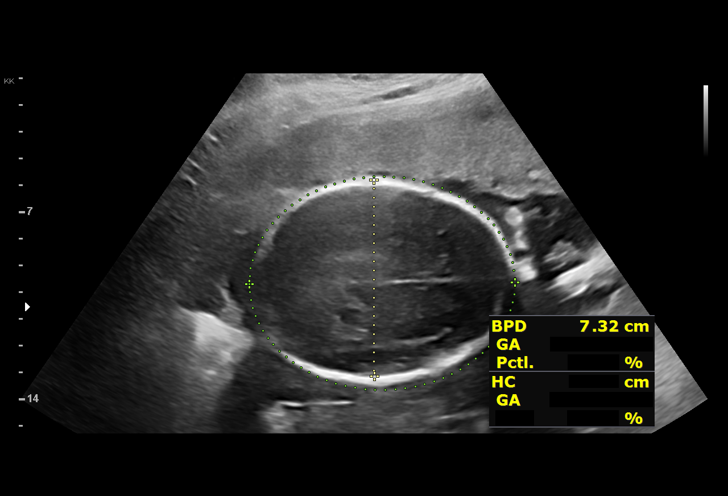
[im 73/110]
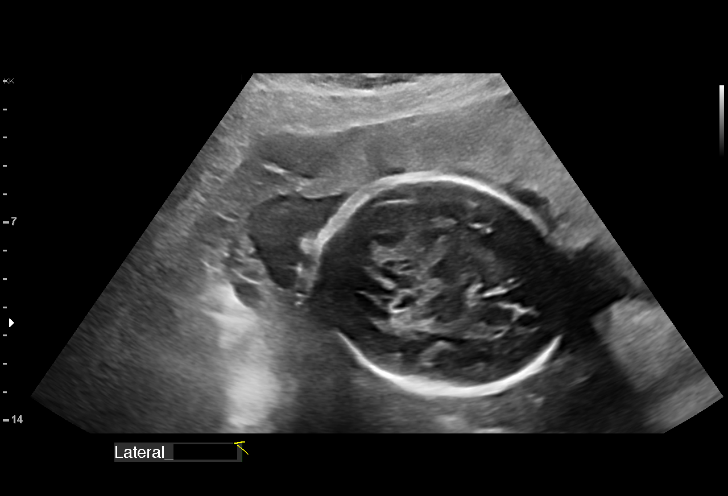
[im 81/110]
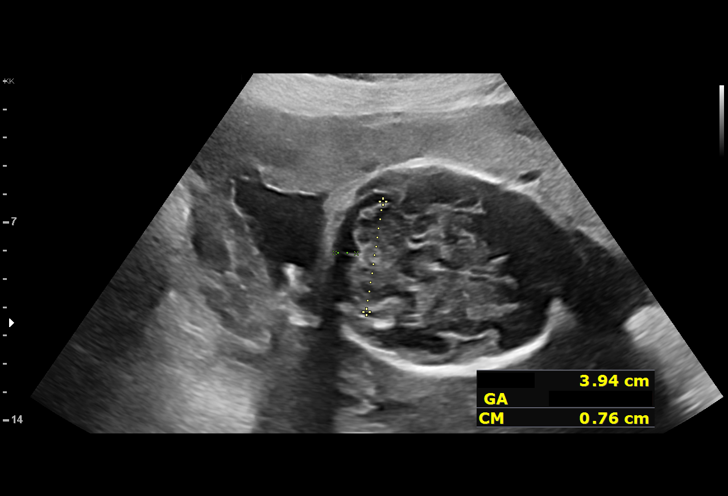
[im 89/110]
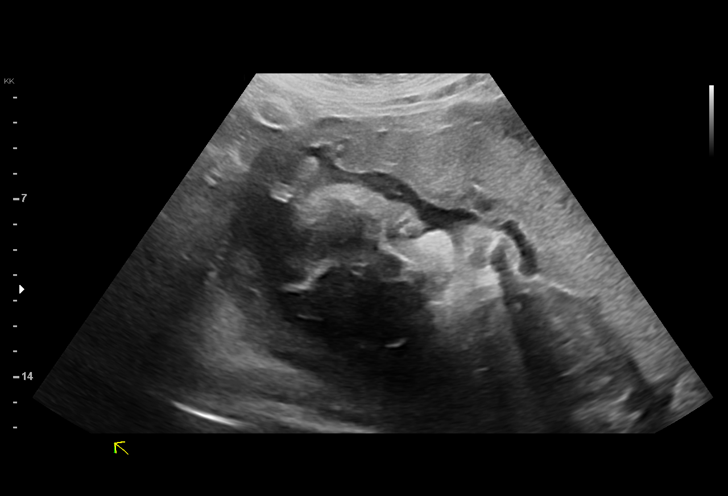
[im 97/110]
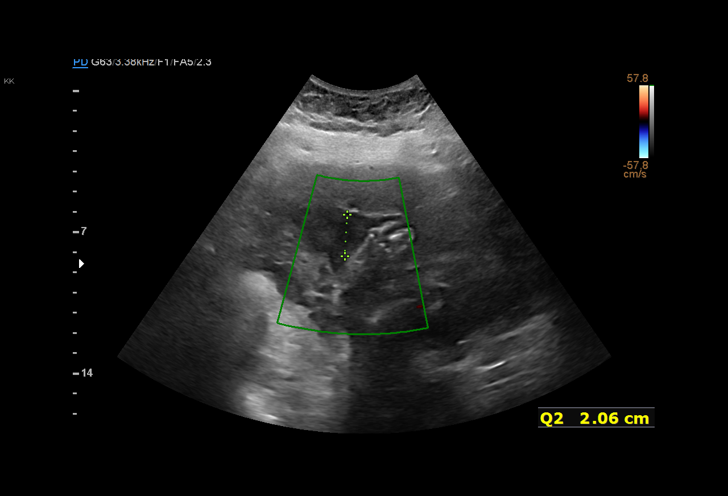
[im 105/110]
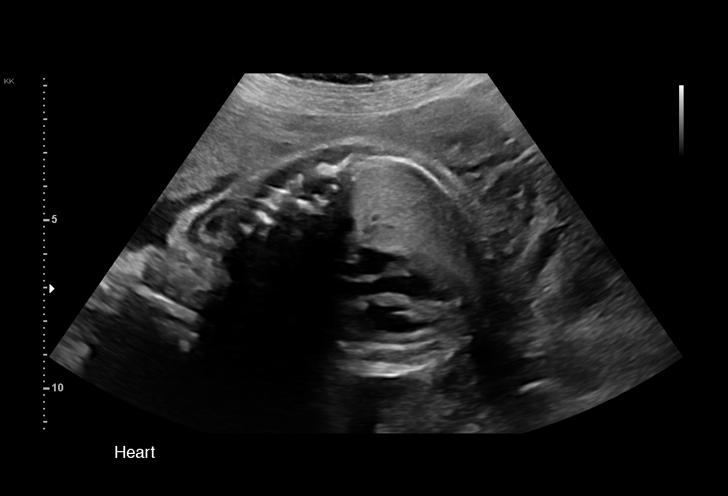

[13 of 28 positions shown; findings below may reference images not displayed]

[DQ]

Indications

 29 weeks gestation of pregnancy
 Encounter for antenatal screening for          [DQ]
 malformations
 Obesity complicating pregnancy, third          [DQ]
 trimester(Pregravid BMI 39)
 History of cesarean delivery, currently        [DQ]
 pregnant
Fetal Evaluation

 Num Of Fetuses:         1
 Fetal Heart Rate(bpm):  126
 Cardiac Activity:       Observed
 Presentation:           Transverse, head to maternal right
 Placenta:               Anterior
 P. Cord Insertion:      Visualized

 Amniotic Fluid
 AFI FV:      Within normal limits

 AFI Sum(cm)     %Tile       Largest Pocket(cm)
 15.5            55

 RUQ(cm)       RLQ(cm)       LUQ(cm)        LLQ(cm)
 5
Biometry

 BPD:      73.3  mm     G. Age:  29w 3d         37  %    CI:         69.4   %    70 - 86
                                                         FL/HC:      21.9   %    19.6 -
 HC:      280.9  mm     G. Age:  30w 5d         56  %    HC/AC:      1.06        0.99 -
 AC:       264   mm     G. Age:  30w 4d         76  %    FL/BPD:     83.8   %    71 - 87
 FL:       61.4  mm     G. Age:  31w 6d         92  %    FL/AC:      23.3   %    20 - 24
 HUM:      55.1  mm     G. Age:  32w 0d       > 95  %
 CER:      39.4  mm     G. Age:  32w 0d     > 97.7  %
 CM:        7.6  mm

 Est. FW:    [DQ]  gm    3 lb 10 oz      86  %
OB History

 Gravidity:    2         Term:   1        Prem:   0        SAB:   0
 TOP:          0       Ectopic:  0        Living: 1
Gestational Age

 LMP:           29w 3d        Date:  [DATE]                 EDD:   [DATE]
 U/S Today:     30w 5d                                        EDD:   [DATE]
 Best:          29w 3d     Det. By:  LMP  ([DATE])          EDD:   [DATE]
Anatomy

 Cranium:               Appears normal         Aortic Arch:            Appears normal
 Cavum:                 Appears normal         Ductal Arch:            Not well visualized
 Ventricles:            Appears normal         Diaphragm:              Appears normal
 Choroid Plexus:        Appears normal         Stomach:                Appears normal, left
                                                                       sided
 Cerebellum:            Appears normal         Abdomen:                Appears normal
 Posterior Fossa:       Appears normal         Abdominal Wall:         Not well visualized
 Nuchal Fold:           Not applicable (>20    Cord Vessels:           Appears normal (3
                        wks [place])                                        vessel cord)
 Face:                  Orbits appear          Kidneys:                Appear normal
                        normal
 Lips:                  Appears normal         Bladder:                Appears normal
 Thoracic:              Appears normal         Spine:                  Ltd views no
                                                                       intracranial signs of
                                                                       NTD
 Heart:                 Not well visualized    Upper Extremities:      Not well visualized
 RVOT:                  Not well visualized    Lower Extremities:      Appears normal
 LVOT:                  Not well visualized

 Other:  Technically difficult due to advanced gestational age. Technically
         difficult due to maternal habitus and fetal position.
Cervix Uterus Adnexa

 Cervix
 Not visualized (advanced GA >[DQ])
Impression

 G2 P1. Patient recently moved from [place].
 She had opted not to screen for fetal aneuploidies.
 Obstetric history significant for a term cesarean delivery in
 [DQ] of a male infant weighing 9 pounds at birth.
 Patient has no chronic medical conditions.  She does not
 have gestational diabetes.  Blood pressure today at her office
 is 117/66 mmHg.
 Fetal growth is appropriate for gestational age.  Amniotic fluid
 is normal and good fetal activity seen.  Fetal anatomical
 survey appears normal but limited by advanced gestational
 age.
 Maternal body habitus imposed limitations on the resolution
 of images.
Recommendations

 -An appointment was made for her to return in 4 weeks for
 fetal growth assessment and completion of fetal anatomy if
 feasible.
                 PAULINA

## 2020-09-06 ENCOUNTER — Telehealth (INDEPENDENT_AMBULATORY_CARE_PROVIDER_SITE_OTHER): Payer: 59 | Admitting: Obstetrics and Gynecology

## 2020-09-06 DIAGNOSIS — O34219 Maternal care for unspecified type scar from previous cesarean delivery: Secondary | ICD-10-CM

## 2020-09-06 DIAGNOSIS — Z3A30 30 weeks gestation of pregnancy: Secondary | ICD-10-CM

## 2020-09-06 DIAGNOSIS — O0990 Supervision of high risk pregnancy, unspecified, unspecified trimester: Secondary | ICD-10-CM

## 2020-09-06 NOTE — Progress Notes (Signed)
    TELEHEALTH OBSTETRICS VISIT ENCOUNTER NOTE  Provider location: Center for [facility]   Patient location: Home  I connected with Alexandria Rogers on 09/06/20 at  3:15 PM EDT by telephone at home and verified that I am speaking with the correct person using two identifiers. Of note, unable to do video encounter due to technical difficulties.    I discussed the limitations, risks, security and privacy concerns of performing an evaluation and management service by telephone and the availability of in person appointments. I also discussed with the patient that there may be a patient responsible charge related to this service. The patient expressed understanding and agreed to proceed.  Subjective:  Alexandria Rogers is a 35 y.o. G2P1001 at [redacted]w[redacted]d being followed for ongoing prenatal care.  She is currently monitored for the following issues for this high-risk pregnancy and has Exercise-induced asthma; Supervision of high risk pregnancy, antepartum; and Previous cesarean delivery affecting pregnancy, antepartum on their problem list.  Patient reports no complaints. Reports fetal movement. Denies any contractions, bleeding or leaking of fluid.   The following portions of the patient's history were reviewed and updated as appropriate: allergies, current medications, past family history, past medical history, past social history, past surgical history and problem list.   Objective:  Blood pressure 133/85, last menstrual period 02/08/2020. General:  Alert, oriented and cooperative.   Mental Status: Normal mood and affect perceived. Normal judgment and thought content.  Rest of physical exam deferred due to type of encounter  Assessment and Plan:  Pregnancy: G2P1001 at [redacted]w[redacted]d 1. Supervision of high risk pregnancy, antepartum  2. Previous cesarean delivery affecting pregnancy, antepartum Has rpt 6/30 rpt growth u/s. Decide on delivery route based on growth at that point  Preterm  labor symptoms and general obstetric precautions including but not limited to vaginal bleeding, contractions, leaking of fluid and fetal movement were reviewed in detail with the patient.  I discussed the assessment and treatment plan with the patient. The patient was provided an opportunity to ask questions and all were answered. The patient agreed with the plan and demonstrated an understanding of the instructions. The patient was advised to call back or seek an in-person office evaluation/go to [facility] at [facility] for any urgent or concerning symptoms. Please refer to After Visit Summary for other counseling recommendations.   I provided 7 minutes of non-face-to-face time during this encounter.  No follow-ups on file.  Future Appointments  Date Time Provider Department Center  09/27/2020  1:15 PM [place] Bing, MD [facility]  10/06/2020  3:00 PM [facility] NURSE [facility]  10/06/2020  3:15 PM [facility] US2 [facility]    Ackerly Bing, MD Center for [employer], [facility]

## 2020-09-27 ENCOUNTER — Other Ambulatory Visit: Payer: Self-pay

## 2020-09-27 ENCOUNTER — Telehealth (INDEPENDENT_AMBULATORY_CARE_PROVIDER_SITE_OTHER): Payer: 59 | Admitting: Obstetrics and Gynecology

## 2020-09-27 VITALS — BP 134/80

## 2020-09-27 DIAGNOSIS — O34219 Maternal care for unspecified type scar from previous cesarean delivery: Secondary | ICD-10-CM

## 2020-09-27 DIAGNOSIS — Z3A33 33 weeks gestation of pregnancy: Secondary | ICD-10-CM

## 2020-09-27 DIAGNOSIS — O0990 Supervision of high risk pregnancy, unspecified, unspecified trimester: Secondary | ICD-10-CM

## 2020-09-27 NOTE — Progress Notes (Signed)
I connected with  Alexandria Rogers on 09/27/20 by a video enabled telemedicine application and verified that I am speaking with the correct person using two identifiers.   I discussed the limitations of evaluation and management by telemedicine. The patient expressed understanding and agreed to proceed.

## 2020-09-27 NOTE — Progress Notes (Signed)
    TELEHEALTH OBSTETRICS VISIT ENCOUNTER NOTE  Provider location: Center for [facility] at [facility]   Patient location: Home  I connected with [name] on 09/27/20 at  1:15 PM EDT by telephone at home and verified that I am speaking with the correct person using two identifiers. Of note, unable to do video encounter due to technical difficulties.    I discussed the limitations, risks, security and privacy concerns of performing an evaluation and management service by telephone and the availability of in person appointments. I also discussed with the patient that there may be a patient responsible charge related to this service. The patient expressed understanding and agreed to proceed.  Subjective:  Alexandria Rogers is a 35 y.o. G2P1001 at [redacted]w[redacted]d being followed for ongoing prenatal care.  She is currently monitored for the following issues for this low-risk pregnancy and has Exercise-induced asthma; Supervision of high risk pregnancy, antepartum; and Previous cesarean delivery affecting pregnancy, antepartum on their problem list.  Patient reports no complaints. Reports fetal movement. Denies any contractions, bleeding or leaking of fluid.   The following portions of the patient's history were reviewed and updated as appropriate: allergies, current medications, past family history, past medical history, past social history, past surgical history and problem list.   Objective:  Blood pressure 134/80, last menstrual period 02/08/2020. General:  Alert, oriented and cooperative.   Mental Status: Normal mood and affect perceived. Normal judgment and thought content.  Rest of physical exam deferred due to type of encounter  Assessment and Plan:  Pregnancy: G2P1001 at [redacted]w[redacted]d 1. Supervision of high risk pregnancy, antepartum Routine care. Has rpt growth u/s next week  2. Previous cesarean delivery affecting pregnancy, antepartum If baby is of similar weight to last child then  patient would like rpt c/s. Pt okay with seeing a midwife for next visit as she's decided on what will determine tolac vs rpt  3. [redacted] weeks gestation of pregnancy  Preterm labor symptoms and general obstetric precautions including but not limited to vaginal bleeding, contractions, leaking of fluid and fetal movement were reviewed in detail with the patient.  I discussed the assessment and treatment plan with the patient. The patient was provided an opportunity to ask questions and all were answered. The patient agreed with the plan and demonstrated an understanding of the instructions. The patient was advised to call back or seek an in-person office evaluation/go to MAU at [facility] for any urgent or concerning symptoms. Please refer to After Visit Summary for other counseling recommendations.   I provided 8 minutes of non-face-to-face time during this encounter.  No follow-ups on file.  Future Appointments  Date Time Provider Department Center  10/06/2020  3:00 PM [facility] NURSE [facility]  10/06/2020  3:15 PM [facility] US2 [facility]  10/12/2020  3:40 PM Calvert Cantor, CNM [facility]    [place] Bing, MD Center for [facility], [facility]

## 2020-10-06 ENCOUNTER — Ambulatory Visit: Payer: 59 | Admitting: *Deleted

## 2020-10-06 ENCOUNTER — Encounter: Payer: Self-pay | Admitting: *Deleted

## 2020-10-06 ENCOUNTER — Other Ambulatory Visit: Payer: Self-pay

## 2020-10-06 ENCOUNTER — Ambulatory Visit: Payer: 59 | Attending: Obstetrics and Gynecology

## 2020-10-06 VITALS — BP 125/69 | HR 65

## 2020-10-06 DIAGNOSIS — O0990 Supervision of high risk pregnancy, unspecified, unspecified trimester: Secondary | ICD-10-CM | POA: Insufficient documentation

## 2020-10-06 DIAGNOSIS — O99213 Obesity complicating pregnancy, third trimester: Secondary | ICD-10-CM

## 2020-10-06 DIAGNOSIS — E669 Obesity, unspecified: Secondary | ICD-10-CM | POA: Diagnosis not present

## 2020-10-06 DIAGNOSIS — O34219 Maternal care for unspecified type scar from previous cesarean delivery: Secondary | ICD-10-CM

## 2020-10-06 DIAGNOSIS — Z3A34 34 weeks gestation of pregnancy: Secondary | ICD-10-CM

## 2020-10-06 DIAGNOSIS — Z362 Encounter for other antenatal screening follow-up: Secondary | ICD-10-CM | POA: Diagnosis not present

## 2020-10-12 ENCOUNTER — Ambulatory Visit (INDEPENDENT_AMBULATORY_CARE_PROVIDER_SITE_OTHER): Payer: 59 | Admitting: Advanced Practice Midwife

## 2020-10-12 ENCOUNTER — Other Ambulatory Visit: Payer: Self-pay

## 2020-10-12 VITALS — BP 130/75 | HR 57 | Wt 282.0 lb

## 2020-10-12 DIAGNOSIS — O0990 Supervision of high risk pregnancy, unspecified, unspecified trimester: Secondary | ICD-10-CM

## 2020-10-12 DIAGNOSIS — Z3A35 35 weeks gestation of pregnancy: Secondary | ICD-10-CM

## 2020-10-12 DIAGNOSIS — O34219 Maternal care for unspecified type scar from previous cesarean delivery: Secondary | ICD-10-CM

## 2020-10-13 NOTE — Progress Notes (Signed)
   PRENATAL VISIT NOTE  Subjective:  Alexandria Rogers is a 35 y.o. G2P1001 at [redacted]w[redacted]d being seen today for ongoing prenatal care.  She is currently monitored for the following issues for this high-risk pregnancy and has Exercise-induced asthma; Supervision of high risk pregnancy, antepartum; and Previous cesarean delivery affecting pregnancy, antepartum on their problem list.  Patient reports  patient continues to feel conflicted about delivery planning . Previous birth was  emergency cesarean at 9cm for fetal indications. Patient is tearful yet very forthcoming about her concerns . She feels sense of ownership for accepting birth stress on behalf of her baby and voices concern that repeat cesarean would be "taking the easy way out".  Contractions: Not present. Vag. Bleeding: None.  Movement: Present. Denies leaking of fluid.   The following portions of the patient's history were reviewed and updated as appropriate: allergies, current medications, past family history, past medical history, past social history, past surgical history and problem list. Problem list updated.  Objective:   Vitals:   10/12/20 1552  BP: 130/75  Pulse: (!) 57  Weight: 282 lb (127.9 kg)    Fetal Status: Fetal Heart Rate (bpm): 155   Movement: Present     General:  Alert, oriented and cooperative. Patient is in no acute distress.  Skin: Skin is warm and dry. No rash noted.   Cardiovascular: Normal heart rate noted  Respiratory: Normal respiratory effort, no problems with respiration noted  Abdomen: Soft, gravid, appropriate for gestational age.  Pain/Pressure: Present     Pelvic: Cervical exam deferred        Extremities: Normal range of motion.  Edema: Trace  Mental Status: Normal mood and affect. Normal behavior. Normal judgment and thought content.   Assessment and Plan:  Pregnancy: G2P1001 at [redacted]w[redacted]d  1. Supervision of high risk pregnancy, antepartum - No physical concerns - EFW 91%, similar growth trajectory to  previous baby  2. Previous cesarean delivery affecting pregnancy, antepartum - 14 years ago, LGA baby, fetal distress at 9 cm, birth weight 9lb 5oz. No clear drape, wasn't allowed to hold or see baby, couplet was separated - Has signed TOLAC consent, still considering repeat cesarean but has valid concerns about both options, still weighing risks - Reassurance provided that the variables patient is discussing are valid, rational, worthy of contemplation --Discussed up to date interventions in OR to preserve family bonding. Very proud of our OR team and how hard everyone works to keep things safe but feeling like a celebration!!! - Discussed with patient that much in OB has changed in the past 14 years, practice has excellent TOLAC rates but if patient is not at peace with TOLAC she should listen to her instincts. Does not need to decide anything today  3. [redacted] weeks gestation of pregnancy - Swabs next visit  Term labor symptoms and general obstetric precautions including but not limited to vaginal bleeding, contractions, leaking of fluid and fetal movement were reviewed in detail with the patient. Please refer to After Visit Summary for other counseling recommendations.  Return in about 1 week (around 10/19/2020) for 36 week swabs.  Future Appointments  Date Time Provider Department Center  10/18/2020 10:00 AM Anyanwu, Jethro Bastos, MD [facility]    Calvert Cantor, [place]

## 2020-10-18 ENCOUNTER — Encounter: Payer: Self-pay | Admitting: Obstetrics & Gynecology

## 2020-10-18 ENCOUNTER — Other Ambulatory Visit (HOSPITAL_COMMUNITY)
Admission: RE | Admit: 2020-10-18 | Discharge: 2020-10-18 | Disposition: A | Discharge status: Home / Self Care | Payer: 59 | Source: Ambulatory Visit | Attending: Obstetrics & Gynecology | Admitting: Obstetrics & Gynecology

## 2020-10-18 ENCOUNTER — Other Ambulatory Visit: Payer: Self-pay

## 2020-10-18 ENCOUNTER — Ambulatory Visit (INDEPENDENT_AMBULATORY_CARE_PROVIDER_SITE_OTHER): Payer: 59 | Admitting: Obstetrics & Gynecology

## 2020-10-18 VITALS — BP 124/80 | HR 74 | Wt 287.0 lb

## 2020-10-18 DIAGNOSIS — O0990 Supervision of high risk pregnancy, unspecified, unspecified trimester: Secondary | ICD-10-CM | POA: Insufficient documentation

## 2020-10-18 DIAGNOSIS — O3663X0 Maternal care for excessive fetal growth, third trimester, not applicable or unspecified: Secondary | ICD-10-CM

## 2020-10-18 DIAGNOSIS — O34219 Maternal care for unspecified type scar from previous cesarean delivery: Secondary | ICD-10-CM

## 2020-10-18 DIAGNOSIS — Z3A36 36 weeks gestation of pregnancy: Secondary | ICD-10-CM

## 2020-10-18 NOTE — Patient Instructions (Signed)
Return to office for any scheduled appointments. Call the office or go to the MAU at [facility] if:  You begin to have strong, frequent contractions  Your water breaks.  Sometimes it is a big gush of fluid, sometimes it is just a trickle that keeps getting your panties wet or running down your legs  You have vaginal bleeding.  It is normal to have a small amount of spotting if your cervix was checked.   You do not feel your baby moving like normal.  If you do not, get something to eat and drink and lay down and focus on feeling your baby move.   If your baby is still not moving like normal, you should call the office or go to MAU.  Any other obstetric concerns.   

## 2020-10-18 NOTE — Progress Notes (Signed)
PRENATAL VISIT NOTE  Subjective:  Alexandria Rogers is a 35 y.o. G2P1001 at [redacted]w[redacted]d being seen today for ongoing prenatal care.  She is currently monitored for the following issues for this high-risk pregnancy and has Exercise-induced asthma; Supervision of high risk pregnancy, antepartum; and Previous cesarean delivery affecting pregnancy, antepartum on their problem list.  Patient reports no complaints.  Contractions: Not present. Vag. Bleeding: Scant.  Movement: Present. Denies leaking of fluid.   The following portions of the patient's history were reviewed and updated as appropriate: allergies, current medications, past family history, past medical history, past social history, past surgical history and problem list.   Objective:   Vitals:   10/18/20 1004  BP: 124/80  Pulse: 74  Weight: 287 lb (130.2 kg)    Fetal Status: Fetal Heart Rate (bpm): 156 Fundal Height: 36 cm Movement: Present     General:  Alert, oriented and cooperative. Patient is in no acute distress.  Skin: Skin is warm and dry. No rash noted.   Cardiovascular: Normal heart rate noted  Respiratory: Normal respiratory effort, no problems with respiration noted  Abdomen: Soft, gravid, appropriate for gestational age.  Pain/Pressure: Present     Pelvic: Cervical exam performed in the presence of a chaperone Dilation: Closed Effacement (%): Thick Station: Ballotable  Extremities: Normal range of motion.  Edema: Trace  Mental Status: Normal mood and affect. Normal behavior. Normal judgment and thought content.    Imaging: [place] MFM OB FOLLOW UP  Result Date: 10/06/2020 ----------------------------------------------------------------------  OBSTETRICS REPORT                       (Signed Final 10/06/2020 04:31 pm) ---------------------------------------------------------------------- Patient Info  ID #:       [id number]                          D.O.B.:  [birth year] (35 yrs)  Name:       Alexandria Rogers                  Visit Date:  10/06/2020 03:05 pm ---------------------------------------------------------------------- Performed By  Attending:        Noralee Space MD        Secondary Phy.:   [facility]  Performed By:     Emeline Darling BS,      Address:          [address]                    [address]                                                             [address]  Referred By:      Billey Gosling                Location:         Center for Caleen Jobs MD                               [facility] at  [facility]  Ref. Address:     [address]                    [place], [place]                    [address] ---------------------------------------------------------------------- Orders  #  Description                           Code        Ordered By  1  [place] MFM OB FOLLOW UP                   [phone]    Noralee Space ----------------------------------------------------------------------  #  Order #                     Accession #                Episode #  1  [id number]                   [id number]                 [id number] ---------------------------------------------------------------------- Indications  [redacted] weeks gestation of pregnancy                Z3A.34  Obesity complicating pregnancy, third          O99.213  trimester(Pregravid BMI 39)  History of cesarean delivery, currently        O34.219  pregnant  Encounter for other antenatal screening        Z36.2  follow-up ---------------------------------------------------------------------- Fetal Evaluation  Num Of Fetuses:         1  Fetal Heart Rate(bpm):  155  Cardiac Activity:       Observed  Presentation:           Cephalic  Placenta:               Anterior  P. Cord Insertion:      Previously Visualized  Amniotic Fluid  AFI FV:      Within normal limits  AFI Sum(cm)     %Tile       Largest Pocket(cm)  9.9             19           3.8  RUQ(cm)       RLQ(cm)       LUQ(cm)        LLQ(cm)  2.4           2.2           1.5            3.8 ---------------------------------------------------------------------- Biometry  BPD:      85.1  mm     G. Age:  34w 2d         45  %    CI:  78.41   %    70 - 86                                                          FL/HC:      22.5   %    19.4 - 21.8  HC:       304   mm     G. Age:  33w 6d          8  %    HC/AC:      0.90        0.96 - 1.11  AC:      337.8  mm     G. Age:  37w 5d       > 99  %    FL/BPD:     80.4   %    71 - 87  FL:       68.4  mm     G. Age:  35w 1d         60  %    FL/AC:      20.2   %    20 - 24  Est. FW:    2885  gm      6 lb 6 oz     91  % ---------------------------------------------------------------------- OB History  Gravidity:    2         Term:   1        Prem:   0        SAB:   0  TOP:          0       Ectopic:  0        Living: 1 ---------------------------------------------------------------------- Gestational Age  LMP:           34w 3d        Date:  02/08/20                 EDD:   11/14/20  U/S Today:     35w 2d                                        EDD:   11/08/20  Best:          34w 3d     Det. By:  LMP  (02/08/20)          EDD:   11/14/20 ---------------------------------------------------------------------- Anatomy  Cranium:               Appears normal         Aortic Arch:            Previously seen  Cavum:                 Appears normal         Ductal Arch:            Not well visualized  Ventricles:            Appears normal         Diaphragm:              Appears normal  Choroid Plexus:  Previously seen        Stomach:                Appears normal, left                                                                        sided  Cerebellum:            Previously seen        Abdomen:                Appears normal  Posterior Fossa:       Previously seen        Abdominal Wall:         Not well visualized  Nuchal Fold:           Not applicable (>20     Cord Vessels:           Previously seen                         wks GA)  Face:                  Orbits previously      Kidneys:                Appear normal                         seen  Lips:                  Previously seen        Bladder:                Appears normal  Thoracic:              Appears normal         Spine:                  Ltd views no                                                                        intracranial signs of                                                                        NTD  Heart:                 Appears normal         Upper Extremities:      Not well visualized                         (  4CH, axis, and                         situs)  RVOT:                  Not well visualized    Lower Extremities:      Previously seen  LVOT:                  Not well visualized  Other:  Technically difficult due to advanced gestational age. Technically          difficult due to maternal habitus and fetal position. ---------------------------------------------------------------------- Cervix Uterus Adnexa  Cervix  Not visualized (advanced GA >24wks) ---------------------------------------------------------------------- Impression  Patient returned for completion of fetal anatomy .Amniotic  fluid is normal and good fetal activity is seen .Fetal biometry  is consistent with her previously-established dates .Fetal  anatomical survey appears normal. However, it could still not  be completed because of fetal position.  Patient does not have gestational diabetes.  BP at our office: 125/69 mm Hg . ---------------------------------------------------------------------- Recommendations  Follow-up scans as clinically indicated. ----------------------------------------------------------------------                  Noralee Space, MD Electronically Signed Final Report   10/06/2020 04:31 pm ----------------------------------------------------------------------   Assessment and Plan:  Pregnancy:  G2P1001 at [redacted]w[redacted]d 1. Large for gestational age of fetus in third trimester Follow up scan scheduled in 2 weeks, will follow up results and manage accordingly. - [place] MFM OB FOLLOW UP; Future  2. Previous cesarean delivery affecting pregnancy, antepartum Already signed TOLAC consent.  3. [redacted] weeks gestation of pregnancy 4. Supervision of high risk pregnancy, antepartum Pelvic cultures done today, will follow up results and manage accordingly. - Strep Gp B NAA - GC/Chlamydia probe amp ([place])not at [facility] Preterm labor symptoms and general obstetric precautions including but not limited to vaginal bleeding, contractions, leaking of fluid and fetal movement were reviewed in detail with the patient. Please refer to After Visit Summary for other counseling recommendations.   Return in about 1 week (around 10/25/2020) for OFFICE OB VISIT (MD or APP).  No future appointments.  Jaynie Collins, MD

## 2020-10-19 LAB — GC/CHLAMYDIA PROBE AMP (~~LOC~~) NOT AT ARMC
Chlamydia: NEGATIVE
Comment: NEGATIVE
Comment: NORMAL
Neisseria Gonorrhea: NEGATIVE

## 2020-10-20 LAB — STREP GP B NAA: Strep Gp B NAA: NEGATIVE

## 2020-10-24 ENCOUNTER — Ambulatory Visit (INDEPENDENT_AMBULATORY_CARE_PROVIDER_SITE_OTHER): Payer: 59 | Admitting: Obstetrics & Gynecology

## 2020-10-24 ENCOUNTER — Other Ambulatory Visit: Payer: Self-pay

## 2020-10-24 VITALS — BP 136/80 | HR 67 | Wt 284.0 lb

## 2020-10-24 DIAGNOSIS — O0990 Supervision of high risk pregnancy, unspecified, unspecified trimester: Secondary | ICD-10-CM

## 2020-10-24 DIAGNOSIS — Z3A37 37 weeks gestation of pregnancy: Secondary | ICD-10-CM

## 2020-10-24 DIAGNOSIS — O34219 Maternal care for unspecified type scar from previous cesarean delivery: Secondary | ICD-10-CM

## 2020-10-24 DIAGNOSIS — O3663X0 Maternal care for excessive fetal growth, third trimester, not applicable or unspecified: Secondary | ICD-10-CM

## 2020-10-24 NOTE — Progress Notes (Signed)
   PRENATAL VISIT NOTE  Subjective:  Alexandria Rogers is a 35 y.o. G2P1001 at [redacted]w[redacted]d being seen today for ongoing prenatal care.  She is currently monitored for the following issues for this high-risk pregnancy and has Exercise-induced asthma; Supervision of high risk pregnancy, antepartum; and Previous cesarean delivery affecting pregnancy, antepartum on their problem list.  Patient reports no complaints.  Contractions: Not present. Vag. Bleeding: None.  Movement: Present. Denies leaking of fluid.   The following portions of the patient's history were reviewed and updated as appropriate: allergies, current medications, past family history, past medical history, past social history, past surgical history and problem list.   Objective:   Vitals:   10/24/20 1413  BP: 136/80  Pulse: 67  Weight: 284 lb (128.8 kg)    Fetal Status: Fetal Heart Rate (bpm): 132   Movement: Present     General:  Alert, oriented and cooperative. Patient is in no acute distress.  Skin: Skin is warm and dry. No rash noted.   Cardiovascular: Normal heart rate noted  Respiratory: Normal respiratory effort, no problems with respiration noted  Abdomen: Soft, gravid, appropriate for gestational age.  Pain/Pressure: Present     Pelvic: Cervical exam deferred        Extremities: Normal range of motion.     Mental Status: Normal mood and affect. Normal behavior. Normal judgment and thought content.   Assessment and Plan:  Pregnancy: G2P1001 at [redacted]w[redacted]d 1. Large for gestational age of fetus in third trimester 2. Previous cesarean delivery affecting pregnancy, antepartum Last EFW on 6/30 >90%, AC >99%.  Next growth scan is on 11/01/20.  Will review and come up with delivery plan (IOL at 39w for TOLAC vs RCS). She really desires TOLAC, she was told this could be done as long as scan is reassuring.   3. [redacted] weeks gestation of pregnancy 4. Supervision of high risk pregnancy, antepartum Negative pelvic cultures last week, patient  aware.  Labor symptoms and general obstetric precautions including but not limited to vaginal bleeding, contractions, leaking of fluid and fetal movement were reviewed in detail with the patient. Please refer to After Visit Summary for other counseling recommendations.   Return in about 9 days (around 11/02/2020) for OFFICE OB VISIT (MD only) and review growth ultrasound.  Future Appointments  Date Time Provider Department Center  11/01/2020  3:15 PM [facility] NURSE [facility]  11/01/2020  3:30 PM [facility] US3 [facility]  11/02/2020  1:15 PM Reva Bores, MD [facility]    Jaynie Collins, MD

## 2020-10-24 NOTE — Patient Instructions (Signed)
Return to office for any scheduled appointments. Call the office or go to the MAU at [facility] if:  You begin to have strong, frequent contractions  Your water breaks.  Sometimes it is a big gush of fluid, sometimes it is just a trickle that keeps getting your panties wet or running down your legs  You have vaginal bleeding.  It is normal to have a small amount of spotting if your cervix was checked.   You do not feel your baby moving like normal.  If you do not, get something to eat and drink and lay down and focus on feeling your baby move.   If your baby is still not moving like normal, you should call the office or go to MAU.  Any other obstetric concerns.   

## 2020-11-01 ENCOUNTER — Ambulatory Visit: Payer: 59 | Attending: Obstetrics & Gynecology

## 2020-11-01 ENCOUNTER — Ambulatory Visit: Payer: 59

## 2020-11-02 ENCOUNTER — Ambulatory Visit (INDEPENDENT_AMBULATORY_CARE_PROVIDER_SITE_OTHER): Payer: 59 | Admitting: Family Medicine

## 2020-11-02 ENCOUNTER — Ambulatory Visit: Payer: 59 | Admitting: *Deleted

## 2020-11-02 ENCOUNTER — Ambulatory Visit: Payer: 59 | Attending: Obstetrics & Gynecology

## 2020-11-02 ENCOUNTER — Encounter: Payer: Self-pay | Admitting: *Deleted

## 2020-11-02 ENCOUNTER — Other Ambulatory Visit: Payer: Self-pay

## 2020-11-02 ENCOUNTER — Other Ambulatory Visit: Payer: Self-pay | Admitting: Advanced Practice Midwife

## 2020-11-02 VITALS — BP 132/77 | HR 70 | Wt 291.0 lb

## 2020-11-02 VITALS — BP 123/78 | HR 77

## 2020-11-02 DIAGNOSIS — O0990 Supervision of high risk pregnancy, unspecified, unspecified trimester: Secondary | ICD-10-CM

## 2020-11-02 DIAGNOSIS — O34219 Maternal care for unspecified type scar from previous cesarean delivery: Secondary | ICD-10-CM

## 2020-11-02 DIAGNOSIS — O3663X0 Maternal care for excessive fetal growth, third trimester, not applicable or unspecified: Secondary | ICD-10-CM | POA: Insufficient documentation

## 2020-11-02 DIAGNOSIS — E669 Obesity, unspecified: Secondary | ICD-10-CM

## 2020-11-02 DIAGNOSIS — O99213 Obesity complicating pregnancy, third trimester: Secondary | ICD-10-CM

## 2020-11-02 DIAGNOSIS — Z3A38 38 weeks gestation of pregnancy: Secondary | ICD-10-CM

## 2020-11-02 NOTE — Progress Notes (Signed)
   PRENATAL VISIT NOTE  Subjective:  Alexandria Rogers is a 35 y.o. G2P1001 at [redacted]w[redacted]d being seen today for ongoing prenatal care.  She is currently monitored for the following issues for this high-risk pregnancy and has Exercise-induced asthma; Supervision of high risk pregnancy, antepartum; and Previous cesarean delivery affecting pregnancy, antepartum on their problem list.  Patient reports no complaints.  Contractions: Irritability. Vag. Bleeding: None.  Movement: Present. Denies leaking of fluid.   The following portions of the patient's history were reviewed and updated as appropriate: allergies, current medications, past family history, past medical history, past social history, past surgical history and problem list.   Objective:   Vitals:   11/02/20 1323  BP: 132/77  Pulse: 70  Weight: 291 lb (132 kg)    Fetal Status: Fetal Heart Rate (bpm): 128   Movement: Present     General:  Alert, oriented and cooperative. Patient is in no acute distress.  Skin: Skin is warm and dry. No rash noted.   Cardiovascular: Normal heart rate noted  Respiratory: Normal respiratory effort, no problems with respiration noted  Abdomen: Soft, gravid, appropriate for gestational age.  Pain/Pressure: Absent     Pelvic: Cervical exam deferred        Extremities: Normal range of motion.  Edema: None  Mental Status: Normal mood and affect. Normal behavior. Normal judgment and thought content.   Assessment and Plan:  Pregnancy: G2P1001 at [redacted]w[redacted]d 1. Supervision of high risk pregnancy, antepartum GBS negative.  2. Previous cesarean delivery affecting pregnancy, antepartum U/s today shows 7 Lb 11 oz, 68% Strongly desires TOLAC Previous C-section for OP at 9.5 cm with decels, baby was > 9 lbs, but not for arrest and baby was OP. Should be fine for TOLAC, previously signed consent. Limitations of U/s in prediction of EFW discussed at length. IOL scheduled, orders placed.   Term labor symptoms and general  obstetric precautions including but not limited to vaginal bleeding, contractions, leaking of fluid and fetal movement were reviewed in detail with the patient. Please refer to After Visit Summary for other counseling recommendations.   No follow-ups on file.  Future Appointments  Date Time Provider Department Center  11/10/2020 12:00 AM [facility] SCHED ROOM [facility] None    Reva Bores, MD

## 2020-11-03 ENCOUNTER — Encounter (HOSPITAL_COMMUNITY): Payer: Self-pay | Admitting: *Deleted

## 2020-11-03 ENCOUNTER — Telehealth (HOSPITAL_COMMUNITY): Payer: Self-pay | Admitting: *Deleted

## 2020-11-03 NOTE — Telephone Encounter (Signed)
Preadmission screen  

## 2020-11-07 ENCOUNTER — Inpatient Hospital Stay (EMERGENCY_DEPARTMENT_HOSPITAL)
Admission: AD | Admit: 2020-11-07 | Discharge: 2020-11-07 | Disposition: A | Discharge status: Home / Self Care | Payer: 59 | Source: Home / Self Care | Attending: Emergency Medicine | Admitting: Emergency Medicine

## 2020-11-07 ENCOUNTER — Encounter (HOSPITAL_COMMUNITY): Payer: Self-pay

## 2020-11-07 ENCOUNTER — Other Ambulatory Visit: Payer: Self-pay

## 2020-11-07 DIAGNOSIS — O98513 Other viral diseases complicating pregnancy, third trimester: Secondary | ICD-10-CM | POA: Insufficient documentation

## 2020-11-07 DIAGNOSIS — Z0371 Encounter for suspected problem with amniotic cavity and membrane ruled out: Secondary | ICD-10-CM | POA: Diagnosis not present

## 2020-11-07 DIAGNOSIS — J45990 Exercise induced bronchospasm: Secondary | ICD-10-CM | POA: Diagnosis not present

## 2020-11-07 DIAGNOSIS — Z79899 Other long term (current) drug therapy: Secondary | ICD-10-CM | POA: Insufficient documentation

## 2020-11-07 DIAGNOSIS — Z8249 Family history of ischemic heart disease and other diseases of the circulatory system: Secondary | ICD-10-CM | POA: Insufficient documentation

## 2020-11-07 DIAGNOSIS — O99892 Other specified diseases and conditions complicating childbirth: Secondary | ICD-10-CM | POA: Diagnosis not present

## 2020-11-07 DIAGNOSIS — O471 False labor at or after 37 completed weeks of gestation: Secondary | ICD-10-CM | POA: Insufficient documentation

## 2020-11-07 DIAGNOSIS — O9852 Other viral diseases complicating childbirth: Secondary | ICD-10-CM | POA: Diagnosis not present

## 2020-11-07 DIAGNOSIS — O26893 Other specified pregnancy related conditions, third trimester: Secondary | ICD-10-CM | POA: Diagnosis not present

## 2020-11-07 DIAGNOSIS — O99214 Obesity complicating childbirth: Secondary | ICD-10-CM | POA: Diagnosis not present

## 2020-11-07 DIAGNOSIS — Z3A39 39 weeks gestation of pregnancy: Secondary | ICD-10-CM | POA: Insufficient documentation

## 2020-11-07 DIAGNOSIS — O9952 Diseases of the respiratory system complicating childbirth: Secondary | ICD-10-CM | POA: Diagnosis not present

## 2020-11-07 DIAGNOSIS — U071 COVID-19: Secondary | ICD-10-CM | POA: Insufficient documentation

## 2020-11-07 DIAGNOSIS — R030 Elevated blood-pressure reading, without diagnosis of hypertension: Secondary | ICD-10-CM | POA: Insufficient documentation

## 2020-11-07 DIAGNOSIS — O9902 Anemia complicating childbirth: Secondary | ICD-10-CM | POA: Diagnosis not present

## 2020-11-07 DIAGNOSIS — O34211 Maternal care for low transverse scar from previous cesarean delivery: Secondary | ICD-10-CM | POA: Diagnosis not present

## 2020-11-07 LAB — SARS CORONAVIRUS 2 (TAT 6-24 HRS): SARS Coronavirus 2: POSITIVE — AB

## 2020-11-07 LAB — POCT FERN TEST: POCT Fern Test: NEGATIVE

## 2020-11-07 NOTE — Progress Notes (Signed)
Report given to Dr. Crissie Reese. Pt has had a couple of severe range blood pressures and we are not sure if she is ruptured or not. Pt is to be transferred to [facility], MAU for further evaluation.

## 2020-11-07 NOTE — MAU Note (Signed)
Pt reports abdominal pressure this morning and felt 3 gushes of fluid.   Denies vaginal bleeding   Reports +FM

## 2020-11-07 NOTE — Progress Notes (Addendum)
Pt is a G2P1 at [redacted] weeks gestation here with c/o uc's that started this morning around 0800. Pt says she has felt 2 small gushes of fluid. No vaginal bleeding.  Pt gets her care at [facility]. She says she came to [facility] because she is in pain and this hospital was closer. Pt says she tested positive for covid four days ago with a home test. She began  having symptoms six days ago. She had a C/S 14 years agoand has signed a consent for TOLAC. She is scheduled for induction on Wednesday 11/09/20. Pt is in the triage area and is waiting for transfer to a room.

## 2020-11-07 NOTE — MAU Provider Note (Signed)
History     [id number]  Arrival date and time: 11/07/20 1031    Chief Complaint  Patient presents with   Covid Positive   In labor     HPI Alexandria Rogers is a 35 y.o. at [redacted]w[redacted]d presents to MAU from [facility] for evaluation of labor & SROM. Reports some contractions earlier today that have improved. Had a few episodes of clear fluid leaking from her vagina as well. Had some elevated BPs at [facility]> Denies history of hypertension. Denies headache, visual disturbance, or epigastric pain. States she had the small BP cuff initially on her wrist and then on her right arm.  Reports good fetal movement.    OB History     Gravida  2   Para  1   Term  1   Preterm      AB      Living  1      SAB      IAB      Ectopic      Multiple      Live Births  1           Past Medical History:  Diagnosis Date   Asthma     Past Surgical History:  Procedure Laterality Date   CESAREAN SECTION     WISDOM TOOTH EXTRACTION      Family History  Problem Relation Age of Onset   Heart disease Father    Alcohol abuse Father    Lung cancer Maternal Grandmother    Diabetes Paternal Grandmother     Social History   Socioeconomic History   Marital status: Single    Spouse name: Not on file   Number of children: Not on file   Years of education: Not on file   Highest education level: Not on file  Occupational History   Not on file  Tobacco Use   Smoking status: Never   Smokeless tobacco: Never  Vaping Use   Vaping Use: Never used  Substance and Sexual Activity   Alcohol use: Not Currently    Comment: once or twice a week   Drug use: No   Sexual activity: Yes    Partners: Male    Birth control/protection: None  Other Topics Concern   Not on file  Social History Narrative   Not on file   Social Determinants of Health   Financial Resource Strain: Not on file  Food Insecurity: Not on file  Transportation Needs: Not on file  Physical Activity: Not on file   Stress: Not on file  Social Connections: Not on file  Intimate Partner Violence: Not on file    No Known Allergies  No current facility-administered medications on file prior to encounter.   Current Outpatient Medications on File Prior to Encounter  Medication Sig Dispense Refill   albuterol (PROVENTIL HFA;VENTOLIN HFA) 108 (90 BASE) MCG/ACT inhaler Inhale 2 puffs into the lungs every 6 (six) hours as needed for wheezing.     calcium carbonate (TUMS - DOSED IN MG ELEMENTAL CALCIUM) 500 MG chewable tablet Chew 1 tablet by mouth daily.     Prenatal Vit-Fe Fumarate-FA (PRENATAL MULTIVITAMIN) TABS tablet Take 1 tablet by mouth daily at 12 noon.       ROS Pertinent positives and negative per HPI, all others reviewed and negative  Physical Exam   BP 134/89   Pulse 85   Temp 99.1 F (37.3 C) (Oral)   Resp 12   Ht 5\' 7"  (1.702 m)  Wt 132 kg   LMP 02/08/2020 (Exact Date)   SpO2 95%   BMI 45.58 kg/m   Physical Exam Vitals and nursing note reviewed. Exam conducted with a chaperone present.  Constitutional:      General: She is not in acute distress.    Appearance: Normal appearance.  HENT:     Head: Normocephalic and atraumatic.  Eyes:     General: No scleral icterus. Pulmonary:     Effort: Pulmonary effort is normal. No respiratory distress.  Genitourinary:    Comments: Sterile Spec exam: NEFG, no pooling of fluid. Small amount of physiologic discharge. No blood.  Skin:    General: Skin is warm and dry.  Neurological:     Mental Status: She is alert.  Psychiatric:        Mood and Affect: Mood normal.        Behavior: Behavior normal.    Cervical Exam Dilation: 1 Effacement (%): 50 Cervical Position: Posterior Station: -3 Presentation: Undeterminable Exam by:: Mary Early RNC -OB   FHT Baseline 120, moderate variability, 15x15 accels, no decels Toco: irregular Cat: 1  Labs Results for orders placed or performed during the hospital encounter of 11/07/20  (from the past 24 hour(s))  SARS CORONAVIRUS 2 (TAT 6-24 HRS) Nasopharyngeal Nasopharyngeal Swab     Status: Abnormal   Collection Time: 11/07/20 11:17 AM   Specimen: Nasopharyngeal Swab  Result Value Ref Range   SARS Coronavirus 2 POSITIVE (A) NEGATIVE  POCT fern test     Status: Normal   Collection Time: 11/07/20  2:43 PM  Result Value Ref Range   POCT Fern Test Negative = intact amniotic membranes     Imaging No results found.  MAU Course  Procedures  Lab Orders  SARS CORONAVIRUS 2 (TAT 6-24 HRS) Nasopharyngeal Nasopharyngeal Swab  POCT fern test  No orders of the defined types were placed in this encounter.  Imaging Orders  No imaging studies ordered today    MDM Sterile spec exam performed - no pooling of fluid & fern negative.   Pt had elevated BPs at [facility] per review in Epic. Patient states they had the small blue cuff on her. In MAU she remains normotensive. Discrepancy likely due to inappropriate sized cuff being used in the ED. Reviewed with Dr. Alysia Penna who agrees that patient is stable for discharge home without further evaluation.  She has f/u in the office later this week.  Assessment and Plan   1. Encounter for suspected PROM, with rupture of membranes not found   2. [redacted] weeks gestation of pregnancy    -reviewed reasons to return to MAU -keep f/u in the office  Judeth Horn, NP

## 2020-11-07 NOTE — Progress Notes (Signed)
Pt transferred to [facility] MAU by [facility].

## 2020-11-07 NOTE — ED Provider Notes (Signed)
[facility]-EMERGENCY DEPT Provider Note   CSN: [id number] Arrival date & time: 11/07/20  1031     History Chief Complaint  Patient presents with   Covid Positive   In labor    Alexandria Rogers is a 35 y.o. female.  HPI Patient presents for evaluation of contractions, with term pregnancy.  She also felt she leaked some fluid from the vagina that was clear in color.  She has had a sore throat and tested positive for COVID, 6 days ago.  Her husband was COVID-positive.  She has been vaccinated primarily, against COVID, but no boosters.  She denies nausea, vomiting, weakness or dizziness.  There are no other known active modifying factors.    Past Medical History:  Diagnosis Date   Asthma     Patient Active Problem List   Diagnosis Date Noted   Supervision of high risk pregnancy, antepartum 07/14/2020   Previous cesarean delivery affecting pregnancy, antepartum 07/14/2020   Exercise-induced asthma 12/24/2012    Past Surgical History:  Procedure Laterality Date   CESAREAN SECTION     WISDOM TOOTH EXTRACTION       OB History     Gravida  2   Para  1   Term  1   Preterm      AB      Living  1      SAB      IAB      Ectopic      Multiple      Live Births  1           Family History  Problem Relation Age of Onset   Heart disease Father    Alcohol abuse Father    Lung cancer Maternal Grandmother    Diabetes Paternal Grandmother     Social History   Tobacco Use   Smoking status: Never   Smokeless tobacco: Never  Vaping Use   Vaping Use: Never used  Substance Use Topics   Alcohol use: Not Currently    Comment: once or twice a week   Drug use: No    Home Medications Prior to Admission medications   Medication Sig Start Date End Date Taking? Authorizing Provider  albuterol (PROVENTIL HFA;VENTOLIN HFA) 108 (90 BASE) MCG/ACT inhaler Inhale 2 puffs into the lungs every 6 (six) hours as needed for wheezing.     [provider]  calcium carbonate (TUMS - DOSED IN MG ELEMENTAL CALCIUM) 500 MG chewable tablet Chew 1 tablet by mouth daily.    [provider]  Prenatal Vit-Fe Fumarate-FA (PRENATAL MULTIVITAMIN) TABS tablet Take 1 tablet by mouth daily at 12 noon.    [provider]    Allergies    Patient has no known allergies.  Review of Systems   Review of Systems  All other systems reviewed and are negative.  Physical Exam Updated Vital Signs BP (!) 153/94   Pulse 78   Temp 99.8 F (37.7 C) (Oral)   Resp 16   Ht 5\' 7"  (1.702 m)   Wt 132 kg   LMP 02/08/2020 (Exact Date)   SpO2 97%   BMI 45.58 kg/m   Physical Exam Vitals and nursing note reviewed.  Constitutional:      General: She is not in acute distress.    Appearance: She is well-developed. She is not ill-appearing, toxic-appearing or diaphoretic.  HENT:     Head: Normocephalic and atraumatic.     Right Ear: External ear normal.  Left Ear: External ear normal.  Eyes:     Conjunctiva/sclera: Conjunctivae normal.     Pupils: Pupils are equal, round, and reactive to light.  Neck:     Trachea: Phonation normal.  Cardiovascular:     Rate and Rhythm: Normal rate.  Pulmonary:     Effort: Pulmonary effort is normal. No respiratory distress.     Breath sounds: No stridor.  Abdominal:     General: There is distension (Gravid).  Musculoskeletal:        General: Normal range of motion.     Cervical back: Normal range of motion and neck supple.  Skin:    General: Skin is warm and dry.  Neurological:     Mental Status: She is alert and oriented to person, place, and time.     Cranial Nerves: No cranial nerve deficit.     Sensory: No sensory deficit.     Motor: No abnormal muscle tone.     Coordination: Coordination normal.  Psychiatric:        Mood and Affect: Mood normal.        Behavior: Behavior normal.        Thought Content: Thought content normal.        Judgment: Judgment normal.    ED  Results / Procedures / Treatments   Labs (all labs ordered are listed, but only abnormal results are displayed) Labs Reviewed  SARS CORONAVIRUS 2 (TAT 6-24 HRS)    EKG None  Radiology No results found.  Procedures Procedures   Medications Ordered in ED Medications - No data to display  ED Course  I have reviewed the triage vital signs and the nursing notes.  Pertinent labs & imaging results that were available during my care of the patient were reviewed by me and considered in my medical decision making (see chart for details).    MDM Rules/Calculators/A&P                          Patient Vitals for the past 24 hrs:  BP Temp Temp src Pulse Resp SpO2 Height Weight  11/07/20 1115 (!) 153/94 -- -- 78 16 97 % -- --  11/07/20 1105 (!) 153/94 -- -- 78 -- 97 % -- --  11/07/20 1037 -- -- -- -- -- -- 5\' 7"  (1.702 m) 132 kg  11/07/20 1036 (!) 159/69 99.8 F (37.7 C) Oral 73 16 98 % -- --     Medical Decision Making:  This patient is presenting for evaluation of Pregnancy at term with labor, which does require a range of treatment options, and is a complaint that involves a high risk of morbidity and mortality. The differential diagnoses include labor, complications of COVID infection. I decided to review old records, and in summary Depressed patient presenting with labor symptoms with incidental positive COVID.  I Did not require additional historical information from Anyone.  Clinical Laboratory Tests Ordered, included  Viral panel . Review indicates COVID-positive.   Critical Interventions-clinical evaluation, laboratory testing, observation.  Discussion of care with OB rapid response nurse who saw the patient and examined her cervix.  She discussed the case with obstetrics who will evaluate patient at the center.  Patient will be transferred  After These Interventions, the Patient was reevaluated and was found with labor at term, and COVID-positive.  Her COVID illness is 5  to 6 days in duration.  OB rapid response stated she would discuss further care of COVID  and obstetric concerns with the obstetrician.  CRITICAL CARE-no Performed by: Mancel Bale  Nursing Notes Reviewed/ Care Coordinated Applicable Imaging Reviewed Interpretation of Laboratory Data incorporated into ED treatment        Final Clinical Impression(s) / ED Diagnoses Final diagnoses:  Encounter for suspected PROM, with rupture of membranes not found  [redacted] weeks gestation of pregnancy  COVID-19 virus infection    Rx / DC Orders ED Discharge Orders     None        Mancel Bale, MD 11/07/20 [id number]
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
## 2020-11-07 NOTE — ED Notes (Signed)
Called [facility] for update on time. States it will be about an hour

## 2020-11-08 ENCOUNTER — Other Ambulatory Visit: Payer: Self-pay | Admitting: Family Medicine

## 2020-11-08 LAB — SARS CORONAVIRUS 2 (TAT 6-24 HRS): SARS Coronavirus 2: POSITIVE — AB

## 2020-11-10 ENCOUNTER — Inpatient Hospital Stay (HOSPITAL_COMMUNITY)
Admission: AD | Admit: 2020-11-10 | Discharge: 2020-11-13 | DRG: 786 | Disposition: A | Discharge status: Home / Self Care | Payer: 59 | Attending: Obstetrics and Gynecology | Admitting: Obstetrics and Gynecology

## 2020-11-10 ENCOUNTER — Other Ambulatory Visit: Payer: Self-pay

## 2020-11-10 ENCOUNTER — Inpatient Hospital Stay (HOSPITAL_COMMUNITY): Payer: 59

## 2020-11-10 DIAGNOSIS — O26893 Other specified pregnancy related conditions, third trimester: Secondary | ICD-10-CM | POA: Diagnosis present

## 2020-11-10 DIAGNOSIS — O9852 Other viral diseases complicating childbirth: Principal | ICD-10-CM | POA: Diagnosis present

## 2020-11-10 DIAGNOSIS — O34211 Maternal care for low transverse scar from previous cesarean delivery: Secondary | ICD-10-CM | POA: Diagnosis present

## 2020-11-10 DIAGNOSIS — O9902 Anemia complicating childbirth: Secondary | ICD-10-CM | POA: Diagnosis present

## 2020-11-10 DIAGNOSIS — J45990 Exercise induced bronchospasm: Secondary | ICD-10-CM | POA: Diagnosis present

## 2020-11-10 DIAGNOSIS — O99214 Obesity complicating childbirth: Secondary | ICD-10-CM | POA: Diagnosis present

## 2020-11-10 DIAGNOSIS — R030 Elevated blood-pressure reading, without diagnosis of hypertension: Secondary | ICD-10-CM | POA: Diagnosis present

## 2020-11-10 DIAGNOSIS — Z3490 Encounter for supervision of normal pregnancy, unspecified, unspecified trimester: Secondary | ICD-10-CM

## 2020-11-10 DIAGNOSIS — U071 COVID-19: Secondary | ICD-10-CM | POA: Diagnosis present

## 2020-11-10 DIAGNOSIS — O9952 Diseases of the respiratory system complicating childbirth: Secondary | ICD-10-CM | POA: Diagnosis present

## 2020-11-10 DIAGNOSIS — O99892 Other specified diseases and conditions complicating childbirth: Secondary | ICD-10-CM | POA: Diagnosis present

## 2020-11-10 DIAGNOSIS — Z3A39 39 weeks gestation of pregnancy: Secondary | ICD-10-CM | POA: Diagnosis not present

## 2020-11-10 DIAGNOSIS — Z98891 History of uterine scar from previous surgery: Secondary | ICD-10-CM

## 2020-11-10 LAB — CBC
HCT: 36.4 % (ref 36.0–46.0)
Hemoglobin: 12.4 g/dL (ref 12.0–15.0)
MCH: 31.1 pg (ref 26.0–34.0)
MCHC: 34.1 g/dL (ref 30.0–36.0)
MCV: 91.2 fL (ref 80.0–100.0)
Platelets: 215 10*3/uL (ref 150–400)
RBC: 3.99 MIL/uL (ref 3.87–5.11)
RDW: 13.8 % (ref 11.5–15.5)
WBC: 8 10*3/uL (ref 4.0–10.5)
nRBC: 0 % (ref 0.0–0.2)

## 2020-11-10 LAB — TYPE AND SCREEN
ABO/RH(D): B POS
Antibody Screen: NEGATIVE

## 2020-11-10 MED ORDER — OXYCODONE-ACETAMINOPHEN 5-325 MG PO TABS
1.0000 | ORAL_TABLET | ORAL | Status: DC | PRN
Start: 2020-11-10 — End: 2020-11-11

## 2020-11-10 MED ORDER — OXYTOCIN-SODIUM CHLORIDE 30-0.9 UT/500ML-% IV SOLN: 2.5000 [IU]/h | INTRAVENOUS | Status: DC

## 2020-11-10 MED ORDER — TERBUTALINE SULFATE 1 MG/ML IJ SOLN: 0.2500 mg | Freq: Once | INTRAMUSCULAR | Status: DC | PRN

## 2020-11-10 MED ORDER — LIDOCAINE HCL (PF) 1 % IJ SOLN: 30.0000 mL | INTRAMUSCULAR | Status: DC | PRN

## 2020-11-10 MED ORDER — SOD CITRATE-CITRIC ACID 500-334 MG/5ML PO SOLN
30.0000 mL | ORAL | Status: DC | PRN
  Filled 2020-11-10: qty 30

## 2020-11-10 MED ORDER — OXYTOCIN-SODIUM CHLORIDE 30-0.9 UT/500ML-% IV SOLN
1.0000 m[IU]/min | INTRAVENOUS | Status: DC
Start: 2020-11-10 — End: 2020-11-11
  Administered 2020-11-11: 2 m[IU]/min via INTRAVENOUS
  Filled 2020-11-10: qty 500

## 2020-11-10 MED ORDER — LACTATED RINGERS IV SOLN: INTRAVENOUS | Status: DC

## 2020-11-10 MED ORDER — FLEET ENEMA 7-19 GM/118ML RE ENEM: 1.0000 | ENEMA | RECTAL | Status: DC | PRN

## 2020-11-10 MED ORDER — ACETAMINOPHEN 325 MG PO TABS: 650.0000 mg | ORAL_TABLET | ORAL | Status: DC | PRN

## 2020-11-10 MED ORDER — ONDANSETRON HCL 4 MG/2ML IJ SOLN
4.0000 mg | Freq: Four times a day (QID) | INTRAMUSCULAR | Status: DC | PRN
  Filled 2020-11-10: qty 2

## 2020-11-10 MED ORDER — OXYCODONE-ACETAMINOPHEN 5-325 MG PO TABS: 2.0000 | ORAL_TABLET | ORAL | Status: DC | PRN

## 2020-11-10 MED ORDER — OXYTOCIN BOLUS FROM INFUSION: 333.0000 mL | Freq: Once | INTRAVENOUS | Status: DC

## 2020-11-10 MED ORDER — LACTATED RINGERS IV SOLN
500.0000 mL | INTRAVENOUS | Status: DC | PRN
  Administered 2020-11-11 (×2): 500 mL via INTRAVENOUS

## 2020-11-11 ENCOUNTER — Inpatient Hospital Stay (HOSPITAL_COMMUNITY): Payer: 59 | Admitting: Anesthesiology

## 2020-11-11 ENCOUNTER — Encounter (HOSPITAL_COMMUNITY): Payer: 59

## 2020-11-11 ENCOUNTER — Encounter (HOSPITAL_COMMUNITY)
Admission: AD | Disposition: A | Discharge status: Home / Self Care | Payer: Self-pay | Source: Home / Self Care | Attending: Obstetrics and Gynecology

## 2020-11-11 ENCOUNTER — Encounter (HOSPITAL_COMMUNITY): Payer: Self-pay | Admitting: Obstetrics and Gynecology

## 2020-11-11 DIAGNOSIS — O34211 Maternal care for low transverse scar from previous cesarean delivery: Secondary | ICD-10-CM | POA: Diagnosis not present

## 2020-11-11 DIAGNOSIS — Z3A39 39 weeks gestation of pregnancy: Secondary | ICD-10-CM | POA: Diagnosis not present

## 2020-11-11 DIAGNOSIS — O99892 Other specified diseases and conditions complicating childbirth: Secondary | ICD-10-CM | POA: Diagnosis not present

## 2020-11-11 LAB — COMPREHENSIVE METABOLIC PANEL
ALT: 17 U/L (ref 0–44)
AST: 19 U/L (ref 15–41)
Albumin: 2.6 g/dL — ABNORMAL LOW (ref 3.5–5.0)
Alkaline Phosphatase: 113 U/L (ref 38–126)
Anion gap: 8 (ref 5–15)
BUN: 8 mg/dL (ref 6–20)
CO2: 23 mmol/L (ref 22–32)
Calcium: 8.9 mg/dL (ref 8.9–10.3)
Chloride: 104 mmol/L (ref 98–111)
Creatinine, Ser: 0.82 mg/dL (ref 0.44–1.00)
GFR, Estimated: >60 mL/min (ref >60)
Glucose, Bld: 86 mg/dL (ref 70–99)
Potassium: 3.8 mmol/L (ref 3.5–5.1)
Sodium: 135 mmol/L (ref 135–145)
Total Bilirubin: 0.5 mg/dL (ref 0.3–1.2)
Total Protein: 5.9 g/dL — ABNORMAL LOW (ref 6.5–8.1)

## 2020-11-11 LAB — CBC
HCT: 35.4 % — ABNORMAL LOW (ref 36.0–46.0)
Hemoglobin: 12.2 g/dL (ref 12.0–15.0)
MCH: 31.5 pg (ref 26.0–34.0)
MCHC: 34.5 g/dL (ref 30.0–36.0)
MCV: 91.5 fL (ref 80.0–100.0)
Platelets: 199 10*3/uL (ref 150–400)
RBC: 3.87 MIL/uL (ref 3.87–5.11)
RDW: 14.1 % (ref 11.5–15.5)
WBC: 12.8 10*3/uL — ABNORMAL HIGH (ref 4.0–10.5)
nRBC: 0 % (ref 0.0–0.2)

## 2020-11-11 LAB — PROTEIN / CREATININE RATIO, URINE
Creatinine, Urine: 168.16 mg/dL
Protein Creatinine Ratio: 0.11 mg/mg{Cre} (ref 0.00–0.15)
Total Protein, Urine: 19 mg/dL

## 2020-11-11 LAB — RPR: RPR Ser Ql: NONREACTIVE

## 2020-11-11 SURGERY — Surgical Case
Anesthesia: Epidural

## 2020-11-11 MED ORDER — MEPERIDINE HCL 25 MG/ML IJ SOLN: 6.2500 mg | INTRAMUSCULAR | Status: DC | PRN

## 2020-11-11 MED ORDER — CEFAZOLIN IN SODIUM CHLORIDE 3-0.9 GM/100ML-% IV SOLN
INTRAVENOUS | Status: AC
  Filled 2020-11-11: qty 100

## 2020-11-11 MED ORDER — WITCH HAZEL-GLYCERIN EX PADS: 1.0000 "application " | MEDICATED_PAD | CUTANEOUS | Status: DC | PRN

## 2020-11-11 MED ORDER — CEFAZOLIN IN SODIUM CHLORIDE 3-0.9 GM/100ML-% IV SOLN
3.0000 g | INTRAVENOUS | Status: AC
  Administered 2020-11-11: 3 g via INTRAVENOUS
  Filled 2020-11-11 (×2): qty 100

## 2020-11-11 MED ORDER — OXYCODONE HCL 5 MG PO TABS
5.0000 mg | ORAL_TABLET | ORAL | Status: DC | PRN
  Administered 2020-11-12 – 2020-11-13 (×3): 5 mg via ORAL
  Filled 2020-11-11 (×2): qty 1
  Filled 2020-11-11: qty 2

## 2020-11-11 MED ORDER — IBUPROFEN 600 MG PO TABS
600.0000 mg | ORAL_TABLET | Freq: Four times a day (QID) | ORAL | Status: DC
  Administered 2020-11-12 – 2020-11-13 (×4): 600 mg via ORAL
  Filled 2020-11-11 (×4): qty 1

## 2020-11-11 MED ORDER — NALBUPHINE HCL 10 MG/ML IJ SOLN: 5.0000 mg | Freq: Once | INTRAMUSCULAR | Status: DC | PRN

## 2020-11-11 MED ORDER — MEPERIDINE HCL 25 MG/ML IJ SOLN
INTRAMUSCULAR | Status: DC | PRN
  Administered 2020-11-11 (×2): 12.5 mg via INTRAVENOUS

## 2020-11-11 MED ORDER — MORPHINE SULFATE (PF) 0.5 MG/ML IJ SOLN
INTRAMUSCULAR | Status: AC
  Filled 2020-11-11: qty 10

## 2020-11-11 MED ORDER — DIPHENHYDRAMINE HCL 50 MG/ML IJ SOLN: 12.5000 mg | INTRAMUSCULAR | Status: DC | PRN

## 2020-11-11 MED ORDER — COCONUT OIL OIL: 1.0000 "application " | TOPICAL_OIL | Status: DC | PRN

## 2020-11-11 MED ORDER — ONDANSETRON HCL 4 MG/2ML IJ SOLN
INTRAMUSCULAR | Status: AC
  Filled 2020-11-11: qty 2

## 2020-11-11 MED ORDER — EPHEDRINE 5 MG/ML INJ: 10.0000 mg | INTRAVENOUS | Status: DC | PRN

## 2020-11-11 MED ORDER — MEPERIDINE HCL 25 MG/ML IJ SOLN
INTRAMUSCULAR | Status: AC
  Filled 2020-11-11: qty 1

## 2020-11-11 MED ORDER — FAMOTIDINE 20 MG PO TABS
20.0000 mg | ORAL_TABLET | Freq: Two times a day (BID) | ORAL | Status: DC
  Administered 2020-11-11: 20 mg via ORAL
  Filled 2020-11-11: qty 1

## 2020-11-11 MED ORDER — PHENYLEPHRINE 40 MCG/ML (10ML) SYRINGE FOR IV PUSH (FOR BLOOD PRESSURE SUPPORT)
PREFILLED_SYRINGE | INTRAVENOUS | Status: AC
  Filled 2020-11-11: qty 10

## 2020-11-11 MED ORDER — FENTANYL CITRATE (PF) 100 MCG/2ML IJ SOLN
100.0000 ug | INTRAMUSCULAR | Status: DC | PRN
  Administered 2020-11-11 (×2): 100 ug via INTRAVENOUS
  Filled 2020-11-11 (×2): qty 2

## 2020-11-11 MED ORDER — DIPHENHYDRAMINE HCL 25 MG PO CAPS: 25.0000 mg | ORAL_CAPSULE | Freq: Four times a day (QID) | ORAL | Status: DC | PRN

## 2020-11-11 MED ORDER — MORPHINE SULFATE (PF) 0.5 MG/ML IJ SOLN
INTRAMUSCULAR | Status: DC | PRN
  Administered 2020-11-11: 3 mg via EPIDURAL

## 2020-11-11 MED ORDER — KETOROLAC TROMETHAMINE 30 MG/ML IJ SOLN
30.0000 mg | Freq: Four times a day (QID) | INTRAMUSCULAR | Status: AC
  Administered 2020-11-11 – 2020-11-12 (×4): 30 mg via INTRAVENOUS
  Filled 2020-11-11 (×4): qty 1

## 2020-11-11 MED ORDER — SIMETHICONE 80 MG PO CHEW
80.0000 mg | CHEWABLE_TABLET | Freq: Three times a day (TID) | ORAL | Status: DC
  Administered 2020-11-11 – 2020-11-13 (×5): 80 mg via ORAL
  Filled 2020-11-11 (×5): qty 1

## 2020-11-11 MED ORDER — DIBUCAINE (PERIANAL) 1 % EX OINT: 1.0000 "application " | TOPICAL_OINTMENT | CUTANEOUS | Status: DC | PRN

## 2020-11-11 MED ORDER — SCOPOLAMINE 1 MG/3DAYS TD PT72
MEDICATED_PATCH | TRANSDERMAL | Status: AC
  Filled 2020-11-11: qty 1

## 2020-11-11 MED ORDER — NALBUPHINE HCL 10 MG/ML IJ SOLN: 5.0000 mg | INTRAMUSCULAR | Status: DC | PRN

## 2020-11-11 MED ORDER — NALOXONE HCL 0.4 MG/ML IJ SOLN: 0.4000 mg | INTRAMUSCULAR | Status: DC | PRN

## 2020-11-11 MED ORDER — SOD CITRATE-CITRIC ACID 500-334 MG/5ML PO SOLN
30.0000 mL | ORAL | Status: AC
  Administered 2020-11-11: 30 mL via ORAL

## 2020-11-11 MED ORDER — SODIUM CHLORIDE 0.9% FLUSH: 3.0000 mL | INTRAVENOUS | Status: DC | PRN

## 2020-11-11 MED ORDER — TETANUS-DIPHTH-ACELL PERTUSSIS 5-2.5-18.5 LF-MCG/0.5 IM SUSY: 0.5000 mL | PREFILLED_SYRINGE | Freq: Once | INTRAMUSCULAR | Status: DC

## 2020-11-11 MED ORDER — LACTATED RINGERS AMNIOINFUSION: INTRAVENOUS | Status: DC

## 2020-11-11 MED ORDER — PRENATAL MULTIVITAMIN CH
1.0000 | ORAL_TABLET | Freq: Every day | ORAL | Status: DC
  Administered 2020-11-12 – 2020-11-13 (×2): 1 via ORAL
  Filled 2020-11-11 (×2): qty 1

## 2020-11-11 MED ORDER — LACTATED RINGERS IV SOLN: INTRAVENOUS | Status: DC | PRN

## 2020-11-11 MED ORDER — LACTATED RINGERS IV SOLN: INTRAVENOUS | Status: DC

## 2020-11-11 MED ORDER — PHENYLEPHRINE 40 MCG/ML (10ML) SYRINGE FOR IV PUSH (FOR BLOOD PRESSURE SUPPORT)
80.0000 ug | PREFILLED_SYRINGE | INTRAVENOUS | Status: DC | PRN
  Filled 2020-11-11: qty 10

## 2020-11-11 MED ORDER — NALBUPHINE HCL 10 MG/ML IJ SOLN
5.0000 mg | INTRAMUSCULAR | Status: DC | PRN
Start: 2020-11-11 — End: 2020-11-13

## 2020-11-11 MED ORDER — OXYCODONE HCL 5 MG/5ML PO SOLN
5.0000 mg | Freq: Once | ORAL | Status: DC | PRN
Start: 2020-11-11 — End: 2020-11-11

## 2020-11-11 MED ORDER — LIDOCAINE HCL (PF) 1 % IJ SOLN
INTRAMUSCULAR | Status: DC | PRN
  Administered 2020-11-11: 11 mL via EPIDURAL

## 2020-11-11 MED ORDER — MEASLES, MUMPS & RUBELLA VAC IJ SOLR: 0.5000 mL | Freq: Once | INTRAMUSCULAR | Status: DC

## 2020-11-11 MED ORDER — OXYCODONE HCL 5 MG PO TABS: 5.0000 mg | ORAL_TABLET | Freq: Once | ORAL | Status: DC | PRN

## 2020-11-11 MED ORDER — DIPHENHYDRAMINE HCL 50 MG/ML IJ SOLN
12.5000 mg | INTRAMUSCULAR | Status: DC | PRN
Start: 2020-11-11 — End: 2020-11-11

## 2020-11-11 MED ORDER — MENTHOL 3 MG MT LOZG: 1.0000 | LOZENGE | OROMUCOSAL | Status: DC | PRN

## 2020-11-11 MED ORDER — MEDROXYPROGESTERONE ACETATE 150 MG/ML IM SUSP: 150.0000 mg | INTRAMUSCULAR | Status: DC | PRN

## 2020-11-11 MED ORDER — PHENYLEPHRINE 40 MCG/ML (10ML) SYRINGE FOR IV PUSH (FOR BLOOD PRESSURE SUPPORT)
80.0000 ug | PREFILLED_SYRINGE | INTRAVENOUS | Status: DC | PRN
  Administered 2020-11-11: 80 ug via INTRAVENOUS

## 2020-11-11 MED ORDER — DIPHENHYDRAMINE HCL 25 MG PO CAPS: 25.0000 mg | ORAL_CAPSULE | ORAL | Status: DC | PRN

## 2020-11-11 MED ORDER — NALOXONE HCL 4 MG/10ML IJ SOLN
1.0000 ug/kg/h | INTRAVENOUS | Status: DC | PRN
  Filled 2020-11-11: qty 5

## 2020-11-11 MED ORDER — FENTANYL-BUPIVACAINE-NACL 0.5-0.125-0.9 MG/250ML-% EP SOLN
12.0000 mL/h | EPIDURAL | Status: DC | PRN
  Administered 2020-11-11: 12 mL/h via EPIDURAL
  Filled 2020-11-11: qty 250

## 2020-11-11 MED ORDER — LIDOCAINE-EPINEPHRINE (PF) 2 %-1:200000 IJ SOLN
INTRAMUSCULAR | Status: DC | PRN
  Administered 2020-11-11: 3 mL via EPIDURAL
  Administered 2020-11-11: 5 mL via EPIDURAL

## 2020-11-11 MED ORDER — PROMETHAZINE HCL 25 MG/ML IJ SOLN: 6.2500 mg | INTRAMUSCULAR | Status: DC | PRN

## 2020-11-11 MED ORDER — KETOROLAC TROMETHAMINE 30 MG/ML IJ SOLN: 30.0000 mg | Freq: Once | INTRAMUSCULAR | Status: DC | PRN

## 2020-11-11 MED ORDER — OXYTOCIN-SODIUM CHLORIDE 30-0.9 UT/500ML-% IV SOLN: 2.5000 [IU]/h | INTRAVENOUS | Status: AC

## 2020-11-11 MED ORDER — HYDROMORPHONE HCL 1 MG/ML IJ SOLN
INTRAMUSCULAR | Status: AC
  Filled 2020-11-11: qty 0.5

## 2020-11-11 MED ORDER — SIMETHICONE 80 MG PO CHEW: 80.0000 mg | CHEWABLE_TABLET | ORAL | Status: DC | PRN

## 2020-11-11 MED ORDER — SODIUM CHLORIDE 0.9 % IV SOLN
500.0000 mg | INTRAVENOUS | Status: AC
  Administered 2020-11-11: 500 mg via INTRAVENOUS

## 2020-11-11 MED ORDER — OXYTOCIN-SODIUM CHLORIDE 30-0.9 UT/500ML-% IV SOLN
INTRAVENOUS | Status: AC
  Filled 2020-11-11: qty 500

## 2020-11-11 MED ORDER — ENOXAPARIN SODIUM 80 MG/0.8ML IJ SOSY
70.0000 mg | PREFILLED_SYRINGE | INTRAMUSCULAR | Status: DC
  Administered 2020-11-12 – 2020-11-13 (×2): 70 mg via SUBCUTANEOUS
  Filled 2020-11-11 (×2): qty 0.8

## 2020-11-11 MED ORDER — OXYTOCIN-SODIUM CHLORIDE 30-0.9 UT/500ML-% IV SOLN
INTRAVENOUS | Status: DC | PRN
Start: 2020-11-11 — End: 2020-11-11
  Administered 2020-11-11: 300 mL via INTRAVENOUS

## 2020-11-11 MED ORDER — DEXAMETHASONE SODIUM PHOSPHATE 4 MG/ML IJ SOLN
INTRAMUSCULAR | Status: DC | PRN
  Administered 2020-11-11: 4 mg via INTRAVENOUS

## 2020-11-11 MED ORDER — LACTATED RINGERS IV SOLN: 500.0000 mL | Freq: Once | INTRAVENOUS | Status: DC

## 2020-11-11 MED ORDER — SENNOSIDES-DOCUSATE SODIUM 8.6-50 MG PO TABS
2.0000 | ORAL_TABLET | Freq: Every day | ORAL | Status: DC
  Administered 2020-11-12 – 2020-11-13 (×2): 2 via ORAL
  Filled 2020-11-11 (×2): qty 2

## 2020-11-11 MED ORDER — FAMOTIDINE IN NACL 20-0.9 MG/50ML-% IV SOLN: 20.0000 mg | Freq: Two times a day (BID) | INTRAVENOUS | Status: DC

## 2020-11-11 MED ORDER — DEXAMETHASONE SODIUM PHOSPHATE 4 MG/ML IJ SOLN
INTRAMUSCULAR | Status: AC
  Filled 2020-11-11: qty 1

## 2020-11-11 MED ORDER — SCOPOLAMINE 1 MG/3DAYS TD PT72
MEDICATED_PATCH | TRANSDERMAL | Status: DC | PRN
  Administered 2020-11-11: 1 via TRANSDERMAL

## 2020-11-11 MED ORDER — HYDROMORPHONE HCL 1 MG/ML IJ SOLN
0.2500 mg | INTRAMUSCULAR | Status: DC | PRN
  Administered 2020-11-11: 0.5 mg via INTRAVENOUS

## 2020-11-11 MED ORDER — ONDANSETRON HCL 4 MG/2ML IJ SOLN
INTRAMUSCULAR | Status: DC | PRN
  Administered 2020-11-11: 4 mg via INTRAVENOUS

## 2020-11-11 MED ORDER — PHENYLEPHRINE HCL (PRESSORS) 10 MG/ML IV SOLN
INTRAVENOUS | Status: DC | PRN
  Administered 2020-11-11 (×3): 80 ug via INTRAVENOUS

## 2020-11-11 SURGICAL SUPPLY — 35 items
APL SKNCLS STERI-STRIP NONHPOA (GAUZE/BANDAGES/DRESSINGS) ×1
BENZOIN TINCTURE PRP APPL 2/3 (GAUZE/BANDAGES/DRESSINGS) ×1 IMPLANT
CLOTH BEACON ORANGE TIMEOUT ST (SAFETY) ×2 IMPLANT
DRSG OPSITE POSTOP 4X10 (GAUZE/BANDAGES/DRESSINGS) ×2 IMPLANT
ELECT REM PT RETURN 9FT ADLT (ELECTROSURGICAL) ×2
ELECTRODE REM PT RTRN 9FT ADLT (ELECTROSURGICAL) ×1 IMPLANT
EXTRACTOR VACUUM KIWI (MISCELLANEOUS) IMPLANT
GAUZE SPONGE 4X4 12PLY STRL LF (GAUZE/BANDAGES/DRESSINGS) ×2 IMPLANT
GLOVE BIOGEL PI IND STRL 7.0 (GLOVE) ×1 IMPLANT
GLOVE BIOGEL PI INDICATOR 7.0 (GLOVE) ×1
GLOVE SURG ORTHO 8.0 STRL STRW (GLOVE) ×2 IMPLANT
GOWN STRL REUS W/TWL LRG LVL3 (GOWN DISPOSABLE) ×4 IMPLANT
HEMOSTAT ARISTA ABSORB 3G PWDR (HEMOSTASIS) ×1 IMPLANT
KIT ABG SYR 3ML LUER SLIP (SYRINGE) IMPLANT
NDL HYPO 25X5/8 SAFETYGLIDE (NEEDLE) IMPLANT
NEEDLE HYPO 25X5/8 SAFETYGLIDE (NEEDLE) IMPLANT
NS IRRIG 1000ML POUR BTL (IV SOLUTION) ×2 IMPLANT
PACK C SECTION WH (CUSTOM PROCEDURE TRAY) ×2 IMPLANT
PAD ABD 7.5X8 STRL (GAUZE/BANDAGES/DRESSINGS) ×1 IMPLANT
PAD ABD DERMACEA PRESS 5X9 (GAUZE/BANDAGES/DRESSINGS) ×1 IMPLANT
PAD OB MATERNITY 4.3X12.25 (PERSONAL CARE ITEMS) ×2 IMPLANT
PENCIL SMOKE EVAC W/HOLSTER (ELECTROSURGICAL) ×2 IMPLANT
RTRCTR C-SECT PINK 25CM LRG (MISCELLANEOUS) IMPLANT
STRIP CLOSURE SKIN 1/2X4 (GAUZE/BANDAGES/DRESSINGS) IMPLANT
STRIP SURGICAL 1/2 X 6 IN (GAUZE/BANDAGES/DRESSINGS) ×1 IMPLANT
SUT MON AB-0 CT1 36 (SUTURE) ×4 IMPLANT
SUT PLAIN 0 NONE (SUTURE) ×1 IMPLANT
SUT VIC AB 0 CT1 27 (SUTURE) ×6
SUT VIC AB 0 CT1 27XBRD ANBCTR (SUTURE) ×2 IMPLANT
SUT VIC AB 2-0 CT1 27 (SUTURE) ×6
SUT VIC AB 2-0 CT1 TAPERPNT 27 (SUTURE) ×1 IMPLANT
SUT VIC AB 4-0 KS 27 (SUTURE) ×2 IMPLANT
TOWEL OR 17X24 6PK STRL BLUE (TOWEL DISPOSABLE) ×2 IMPLANT
TRAY FOLEY W/BAG SLVR 14FR LF (SET/KITS/TRAYS/PACK) ×2 IMPLANT
WATER STERILE IRR 1000ML POUR (IV SOLUTION) ×2 IMPLANT

## 2020-11-11 NOTE — Discharge Summary (Signed)
Postpartum Discharge Summary     Patient Name: Alexandria Rogers DOB: July 02, 1985 MRN: [id number]  Date of admission: 11/10/2020 Delivery date:11/11/2020  Delivering provider: Griffin Basil  Date of discharge: 11/13/2020  Admitting diagnosis: Term pregnancy [Z34.90] Intrauterine pregnancy: [redacted]w[redacted]d     Secondary diagnosis:  Active Problems:   Hx of cesarean section   Term pregnancy   Delivery of pregnancy by cesarean section  Additional problems: None    Discharge diagnosis: Term Pregnancy Delivered                                              Post partum procedures: IV Venofer Augmentation: AROM and Pitocin Complications: None  Hospital course: Induction of Labor With Cesarean Section   35 y.o. yo G2P2002 at [redacted]w[redacted]d was admitted to the hospital 11/10/2020 for induction of labor. Patient received Pitocin and had artificial rupture of membranes for augmentation. The patient went for cesarean section due to Non-Reassuring FHR. Delivery details are as follows: Membrane Rupture Time/Date: 9:05 AM ,11/11/2020   Delivery Method:C-Section, Low Transverse  Details of operation can be found in separate operative Note.  Patient had an uncomplicated postpartum course. She is ambulating, tolerating a regular diet, passing flatus, and urinating well.  Patient is discharged home in stable condition on 11/11/20.      Newborn Data: Birth date:11/11/2020  Birth time:1:12 PM  Gender:Female  Living status:Living  Apgars:1 ,6  Weight:3230 g                                Magnesium Sulfate received: No BMZ received: No Rhophylac:N/A MMR:N/A T-DaP:Given prenatally Flu: N/A Transfusion:No, IV Venofer  Physical exam  Vitals:   11/11/20 1130 11/11/20 1202 11/11/20 1216 11/11/20 1431  BP: 128/79 127/69  102/88  Pulse:  (!) 48    Resp: 15     Temp:   99.5 F (37.5 C)   TempSrc:   Axillary   SpO2: 99%     Weight:      Height:       General: alert, cooperative, and no distress Lochia:  appropriate Uterine Fundus: firm Incision: Healing well with no significant drainage, No significant erythema DVT Evaluation: No evidence of DVT seen on physical exam. Labs: Lab Results  Component Value Date   WBC 12.8 (H) 11/11/2020   HGB 12.2 11/11/2020   HCT 35.4 (L) 11/11/2020   MCV 91.5 11/11/2020   PLT 199 11/11/2020   CMP Latest Ref Rng & Units 11/11/2020  Glucose 70 - 99 mg/dL 86  BUN 6 - 20 mg/dL 8  Creatinine 0.44 - 1.00 mg/dL 0.82  Sodium 135 - 145 mmol/L 135  Potassium 3.5 - 5.1 mmol/L 3.8  Chloride 98 - 111 mmol/L 104  CO2 22 - 32 mmol/L 23  Calcium 8.9 - 10.3 mg/dL 8.9  Total Protein 6.5 - 8.1 g/dL 5.9(L)  Total Bilirubin 0.3 - 1.2 mg/dL 0.5  Alkaline Phos 38 - 126 U/L 113  AST 15 - 41 U/L 19  ALT 0 - 44 U/L 17   Edinburgh Score: No flowsheet data found.  After visit meds:  Allergies as of 11/13/2020   No Known Allergies      Medication List     TAKE these medications    albuterol 108 (90 Base) MCG/ACT inhaler  Commonly known as: VENTOLIN HFA Inhale 2 puffs into the lungs every 6 (six) hours as needed for wheezing.   calcium carbonate 500 MG chewable tablet Commonly known as: TUMS - dosed in mg elemental calcium Chew 1 tablet by mouth daily.   ibuprofen 600 MG tablet Commonly known as: ADVIL Take 1 tablet (600 mg total) by mouth every 6 (six) hours.   oxyCODONE-acetaminophen 5-325 MG tablet Commonly known as: PERCOCET/ROXICET Take 1 tablet by mouth every 6 (six) hours as needed for severe pain.   prenatal multivitamin Tabs tablet Take 1 tablet by mouth daily at 12 noon.        Discharge home in stable condition Infant Feeding: Breast Infant Disposition:home with mother Discharge instruction: per After Visit Summary and Postpartum booklet. Activity: Advance as tolerated. Pelvic rest for 6 weeks.  Diet: routine diet Future Appointments: Future Appointments  Date Time Provider [place]  11/17/2020  1:30 PM [facility] NURSE [facility]  12/20/2020  2:50 PM Anyanwu, Sallyanne Havers, MD [facility]   Follow up Visit:   Please schedule this patient for a In person postpartum visit in 6 weeks with the following provider: Any provider. Additional Postpartum F/U: Incision check 1 week  High risk pregnancy complicated by:  Hx of cesarean section, obesity Delivery mode:  C-Section, Low Transverse  Anticipated Birth Control:   None     11/13/2020 Renee Harder, CNM

## 2020-11-11 NOTE — Progress Notes (Signed)
Alexandria Rogers is a 35 y.o. G2P1001 at [redacted]w[redacted]d by admitted for induction of labor due to Elective at term.  Subjective:  Feeling some intermittent pressure with contractions.   Objective: BP 128/79   Pulse (!) 246   Temp 98.5 F (36.9 C) (Axillary)   Resp 15   Ht 5\' 7"  (1.702 m)   Wt 130.6 kg   LMP 02/08/2020 (Exact Date)   SpO2 99%   BMI 45.11 kg/m  No intake/output data recorded. No intake/output data recorded.  FHT:  FHR: 155 bpm, variability: moderate,  accelerations:  Present,  decelerations:  Present some late decelerations and some variables. Patient repositioned to left side.  UC:   regular, every 2-3 minutes SVE:   Dilation: 7 Effacement (%): 90 Station: -1 Exam by:: [email or web address], CNM With swollen anterior portion   Labs: Lab Results  Component Value Date   WBC 12.8 (H) 11/11/2020   HGB 12.2 11/11/2020   HCT 35.4 (L) 11/11/2020   MCV 91.5 11/11/2020   PLT 199 11/11/2020    Assessment / Plan: Spontaneous labor, progressing normally Pitocin has been off and patient continues with regular contractions.   Labor:  patient continues to make progress with each check Preeclampsia:   NA Fetal Wellbeing:  Category II Pain Control:  Epidural I/D:  n/a Anticipated MOD:   guarded for VBAC   01/11/2021 DNP, CNM  11/11/20  11:49 AM

## 2020-11-11 NOTE — Progress Notes (Signed)
Labor Progress Note Alexandria Rogers is a 35 y.o. G2P1001 at 18w4dpresented for eIOL and TOLAC S:  Resting comfortably after epidural. Denies headaches, vision changes, chest pain or SOB. Does endorse more pelvic pressure  O:  BP (!) 152/93   Pulse (!) 52   Temp 98.5 F (36.9 C) (Axillary)   Resp 16   Ht _0  (1.702 m)   Wt 130.6 kg   LMP 02/08/2020 (Exact Date)   BMI 45.11 kg/m  EFM: 155bpm/moderate variability/+ accels, intermittent late decels Toco: q2 min  CVE: Dilation: 6 Effacement (%): 80 Station: -2 Presentation: Vertex Exam by:: Alexandria Pugh RN   A&P: 35y.o. G2P1001 [id number] and TOLAC #Labor: Progressing well. Pitocin titrated as per protocol. AROM moderate meconium and IUPC @ 0910 #Pain: Epidural in place #FWB: Cat 2 but reassuring variability and accels and induction is progressing #GBS negative #Elevated BP - has not yet met criteria of elevated pressures > 4 hrs apart and asymptomatic for s/sx PreE. Due to general uptrending of pressures will monitor closely and obtain PreE labs  AnWaldon MerlMD 9:16 AM

## 2020-11-11 NOTE — H&P (Signed)
OBSTETRIC ADMISSION HISTORY AND PHYSICAL  Alexandria Rogers is a 35 y.o. female G2P1001 with IUP at [redacted]w[redacted]d by LMP presenting for elective IOL. She reports +FMs, No LOF, no VB, no blurry vision, headaches or peripheral edema, and RUQ pain.  She plans on breast feeding.  She received her prenatal care at  [facility] of care @ 22wks from [place]  Dating: By LMP --->  Estimated Date of Delivery: 11/14/20  Sono:    @[redacted]w[redacted]d , CWD, normal anatomy, vertex presentation, anterior, 3488g (7#11oz, 68% EFW   Prenatal History/Complications:  -Prior C-section- 2012- NRFHT- late decels with pushing, 9#5oz -BMI: 45  Past Medical History: Past Medical History:  Diagnosis Date   Asthma     Past Surgical History: Past Surgical History:  Procedure Laterality Date   CESAREAN SECTION     WISDOM TOOTH EXTRACTION      Obstetrical History: OB History     Gravida  2   Para  1   Term  1   Preterm      AB      Living  1      SAB      IAB      Ectopic      Multiple      Live Births  1           Social History Social History   Socioeconomic History   Marital status: Single    Spouse name: Not on file   Number of children: Not on file   Years of education: Not on file   Highest education level: Not on file  Occupational History   Not on file  Tobacco Use   Smoking status: Never   Smokeless tobacco: Never  Vaping Use   Vaping Use: Never used  Substance and Sexual Activity   Alcohol use: Not Currently    Comment: once or twice a week   Drug use: No   Sexual activity: Yes    Partners: Male    Birth control/protection: None  Other Topics Concern   Not on file  Social History Narrative   Not on file   Social Determinants of Health   Financial Resource Strain: Not on file  Food Insecurity: Not on file  Transportation Needs: Not on file  Physical Activity: Not on file  Stress: Not on file  Social Connections: Not on file    Family  History: Family History  Problem Relation Age of Onset   Heart disease Father    Alcohol abuse Father    Lung cancer Maternal Grandmother    Diabetes Paternal Grandmother     Allergies: No Known Allergies  Medications Prior to Admission  Medication Sig Dispense Refill Last Dose   calcium carbonate (TUMS - DOSED IN MG ELEMENTAL CALCIUM) 500 MG chewable tablet Chew 1 tablet by mouth daily.   11/10/2020   Prenatal Vit-Fe Fumarate-FA (PRENATAL MULTIVITAMIN) TABS tablet Take 1 tablet by mouth daily at 12 noon.   11/10/2020   albuterol (PROVENTIL HFA;VENTOLIN HFA) 108 (90 BASE) MCG/ACT inhaler Inhale 2 puffs into the lungs every 6 (six) hours as needed for wheezing.   Unknown     Review of Systems   All systems reviewed and negative except as stated in HPI  Blood pressure 134/90, pulse 80, temperature 98.3 F (36.8 C), temperature source Oral, resp. rate 16, height 5\' 7"  (1.702 m), weight 130.6 kg, last menstrual period 02/08/2020. General appearance: alert, cooperative, and no distress Lungs: clear to  auscultation bilaterally Heart: regular rate and rhythm Abdomen: soft, non-tender; bowel sounds normal Pelvic: 1/25/-3, vertex on exam Extremities: Homans sign is negative, no sign of DVT Presentation: cephalic Fetal monitoringBaseline: 120 bpm, Variability: moderate, Accelerations: +15x15 accels, and Decelerations: Absent Uterine activityirritability  Dilation: 1.5 Effacement (%): 20 Station: -3 Exam by:: Dr. Charlotta Newton   Prenatal labs: ABO, Rh: --/--/B POS (08/04 2135) Antibody: NEG (08/04 2135) Rubella: Immune (12/17 0000) RPR: Non Reactive (05/19 1016)  HBsAg: Negative (12/17 0000)  HIV: Non Reactive (05/19 1016)  GBS: Negative/-- (07/12 1000)  2 hr Glucola normal Anatomy US normal  Prenatal Transfer Tool  Maternal Diabetes: No Genetic Screening: Normal Maternal Ultrasounds/Referrals: Normal Fetal Ultrasounds or other Referrals:  None Maternal Substance Abuse:   No Significant Maternal Medications:  None Significant Maternal Lab Results: Group B Strep negative  Results for orders placed or performed during the hospital encounter of 11/10/20 (from the past 24 hour(s))  Type and screen   Collection Time: 11/10/20  9:35 PM  Result Value Ref Range   ABO/RH(D) B POS    Antibody Screen NEG    Sample Expiration      11/13/2020,2359 Performed at [facility], [address] [place], [place] [address]   CBC   Collection Time: 11/10/20  9:36 PM  Result Value Ref Range   WBC 8.0 4.0 - 10.5 K/uL   RBC 3.99 3.87 - 5.11 MIL/uL   Hemoglobin 12.4 12.0 - 15.0 g/dL   HCT 68.0 88.1 - 10.3 %   MCV 91.2 80.0 - 100.0 fL   MCH 31.1 26.0 - 34.0 pg   MCHC 34.1 30.0 - 36.0 g/dL   RDW 15.9 45.8 - 59.2 %   Platelets 215 150 - 400 K/uL   nRBC 0.0 0.0 - 0.2 %    Patient Active Problem List   Diagnosis Date Noted   Term pregnancy 11/10/2020   Supervision of high risk pregnancy, antepartum 07/14/2020   Previous cesarean delivery affecting pregnancy, antepartum 07/14/2020   Exercise-induced asthma 12/24/2012    Assessment/Plan:  Alexandria Rogers is a 35 y.o. G2P1001 at [redacted]w[redacted]d here for eIOL, TOLAC  #Labor: Plan to start Pitocin, Cook balloon attempted unable to place at this time #Pain: IV or epidural upon request #FWB: Cat. I #ID:  GBS negative #MOF: breast #MOC:declined #Circ:  N/a- girl  VBAC consent reviewed including risk/benefit and alternatives.  Consent form signed   Sharon Seller, DO  11/11/2020, 12:58 AM

## 2020-11-11 NOTE — Progress Notes (Signed)
Pt has again started to have repetitive late decelerations.  She has had multiple position changes and fluid bolus but decelerations continue.  Amnioinfusion was started previously for variable decelerations.  Patient and spouse are aware of need for delivery at this time and consent to procedure.  Pt was also a TOLAC and with continued decelerations, delivery needs to be expedited. The risks of surgery were discussed with the patient including but were not limited to: bleeding which may require transfusion or reoperation; infection which may require antibiotics; injury to bowel, bladder, ureters or other surrounding organs; injury to the fetus; need for additional procedures including hysterectomy in the event of a life-threatening hemorrhage; formation of adhesions; placental abnormalities wth subsequent pregnancies; incisional problems; thromboembolic phenomenon and other postoperative/anesthesia complications.  The patient concurred with the proposed plan, giving informed written consent for the procedure.   Patient has been NPO since admission she will remain NPO for procedure. Anesthesia and OR aware. Preoperative prophylactic antibiotics and SCDs ordered on call to the OR.  To OR when ready.   Mariel Aloe, MD Faculty attending Center for [facility]

## 2020-11-11 NOTE — Anesthesia Procedure Notes (Signed)
Epidural Patient location during procedure: OB Start time: 11/11/2020 5:25 AM End time: 11/11/2020 5:29 AM  Staffing Anesthesiologist: Leilani Able, MD Performed: anesthesiologist   Preanesthetic Checklist Completed: patient identified, IV checked, site marked, risks and benefits discussed, surgical consent, monitors and equipment checked, pre-op evaluation and timeout performed  Epidural Patient position: sitting Prep: DuraPrep and site prepped and draped Patient monitoring: continuous pulse ox and blood pressure Approach: midline Location: L3-L4 Injection technique: LOR air  Needle:  Needle type: Tuohy  Needle gauge: 17 G Needle length: 9 cm and 9 Needle insertion depth: 8 cm Catheter type: closed end flexible Catheter size: 19 Gauge Catheter at skin depth: 13 cm Test dose: negative and Other  Assessment Events: blood not aspirated, injection not painful, no injection resistance, no paresthesia and negative IV test  Additional Notes Reason for block:procedure for pain

## 2020-11-11 NOTE — Anesthesia Preprocedure Evaluation (Addendum)
Anesthesia Evaluation  Patient identified by MRN, date of birth, ID band Patient awake    Reviewed: Allergy & Precautions, H&P , Patient's Chart, lab work & pertinent test results  Airway Mallampati: III       Dental no notable dental hx.    Pulmonary    Pulmonary exam normal        Cardiovascular negative cardio ROS Normal cardiovascular exam     Neuro/Psych negative neurological ROS  negative psych ROS   GI/Hepatic negative GI ROS, Neg liver ROS,   Endo/Other  Morbid obesity  Renal/GU negative Renal ROS  negative genitourinary   Musculoskeletal negative musculoskeletal ROS (+)   Abdominal (+) + obese,   Peds  Hematology negative hematology ROS (+)   Anesthesia Other Findings   Reproductive/Obstetrics (+) Pregnancy                             Anesthesia Physical Anesthesia Plan  ASA: 3 and emergent  Anesthesia Plan: Epidural   Post-op Pain Management:    Induction:   PONV Risk Score and Plan:   Airway Management Planned:   Additional Equipment:   Intra-op Plan:   Post-operative Plan:   Informed Consent: I have reviewed the patients History and Physical, chart, labs and discussed the procedure including the risks, benefits and alternatives for the proposed anesthesia with the patient or authorized representative who has indicated his/her understanding and acceptance.       Plan Discussed with:   Anesthesia Plan Comments:        Anesthesia Quick Evaluation

## 2020-11-11 NOTE — Anesthesia Postprocedure Evaluation (Signed)
Anesthesia Post Note  Patient: Alexandria Rogers  Procedure(s) Performed: CESAREAN SECTION     Patient location during evaluation: PACU Anesthesia Type: Epidural Level of consciousness: awake and alert Pain management: pain level controlled Vital Signs Assessment: post-procedure vital signs reviewed and stable Respiratory status: spontaneous breathing, nonlabored ventilation and respiratory function stable Cardiovascular status: blood pressure returned to baseline and stable Postop Assessment: no apparent nausea or vomiting Anesthetic complications: no   No notable events documented.  Last Vitals:  Vitals:   11/11/20 1500 11/11/20 1515  BP: 130/71 128/70  Pulse: (!) 53 (!) 54  Resp: 15 16  Temp:  37.1 C  SpO2: 100% 99%    Last Pain:  Vitals:   11/11/20 1515  TempSrc:   PainSc: 2    Pain Goal:                   Lowella Curb

## 2020-11-11 NOTE — Progress Notes (Signed)
Labor Progress Note Persia Lintner is a 35 y.o. G2P1001 at [redacted]w[redacted]d presented for eIOL and TOLAC S:  To room due to apparent prolonged decel however RN reports it was tracing maternal. Was having consistent shallow lates.  O:  BP 119/69   Pulse (!) 49   Temp 98.5 F (36.9 C) (Axillary)   Resp 16   Ht $R'5\' 7"'AL$  (1.702 m)   Wt 130.6 kg   LMP 02/08/2020 (Exact Date)   BMI 45.11 kg/m  EFM: 155bpm/moderate variability/+ accels, consistent late decels with intermittent variables that resolved s/p amnioinfusion Toco: q2 min  CVE: Dilation: 6 Effacement (%): 80 Station: -2 Presentation: Vertex Exam by:: West Pugh, RN   A&P: 35 y.o. G2P1001 [redacted]w[redacted]d eIOL and TOLAC #Labor: Pitocin was started but turned off due to lates. AROM moderate meconium and IUPC @ 0910. Contractions appear adequate so will watch and see if strip improves after amnioinfusion and stopping pitocin #Pain: Epidural in place #FWB: Cat 2 but still moderate variability. If not progressing will consider CS.  #GBS negative #Elevated BP - has not yet met criteria of elevated pressures > 4 hrs apart and asymptomatic for s/sx PreE. Due to general uptrending of pressures will monitor closely and pending PreE labs  Waldon Merl, MD 10:05 AM

## 2020-11-11 NOTE — Transfer of Care (Signed)
Immediate Anesthesia Transfer of Care Note  Patient: Alexandria Rogers  Procedure(s) Performed: CESAREAN SECTION  Patient Location: PACU  Anesthesia Type:Epidural  Level of Consciousness: awake, alert  and patient cooperative  Airway & Oxygen Therapy: Patient Spontanous Breathing  Post-op Assessment: Report given to RN and Post -op Vital signs reviewed and stable  Post vital signs: Reviewed and stable  Last Vitals:  Vitals Value Taken Time  BP 102/88 11/11/20 1431  Temp 37.1 C 11/11/20 1431  Pulse 57 11/11/20 1442  Resp 15 11/11/20 1442  SpO2 99 % 11/11/20 1442  Vitals shown include unvalidated device data.  Last Pain:  Vitals:   11/11/20 1431  TempSrc:   PainSc: 0-No pain         Complications: No notable events documented.

## 2020-11-11 NOTE — Op Note (Addendum)
Charissa Bash PROCEDURE DATE: 11/11/2020  PREOPERATIVE DIAGNOSES: Intrauterine pregnancy at [redacted]w[redacted]d weeks gestation; non-reassuring fetal status  POSTOPERATIVE DIAGNOSES: The same  PROCEDURE: Repeat Low Transverse Cesarean Section  SURGEON:  Dr. Mariel Aloe  ASSISTANT:  Dr. Chriss Czar, Dr. Evalina Field  ANESTHESIOLOGY TEAM: Anesthesiologist: Leilani Able, MD; Lowella Curb, MD CRNA: Trellis Paganini, CRNA; Lonia Mad, CRNA  INDICATIONS: Alexandria Rogers is a 35 y.o. [phone] at [redacted]w[redacted]d here for cesarean section secondary to the indications listed under preoperative diagnoses; please see preoperative note for further details.  The risks of surgery were discussed with the patient including but were not limited to: bleeding which may require transfusion or reoperation; infection which may require antibiotics; injury to bowel, bladder, ureters or other surrounding organs; injury to the fetus; need for additional procedures including hysterectomy in the event of a life-threatening hemorrhage; formation of adhesions; placental abnormalities wth subsequent pregnancies; incisional problems; thromboembolic phenomenon and other postoperative/anesthesia complications.  The patient concurred with the proposed plan, giving informed written consent for the procedure.    FINDINGS:  Viable female infant in cephalic presentation.  Apgars 1 and 6 and 8 at ten minutes.  Clear amniotic fluid.  Intact placenta, three vessel cord.  Severe adhesive disease.  Omental adhesions  An experienced assistant was required given the standard of surgical care given the complexity of the case.  This assistant was needed for exposure, dissection, suctioning, retraction, instrument exchange, lysis of adhesions assisting with delivery with administration of fundal pressure, and for overall help during the procedure.  This is for cesarean section    ANESTHESIA: Epidural  INTRAVENOUS FLUIDS: 700  ml   ESTIMATED BLOOD LOSS: 1825 ml URINE OUTPUT:  50 ml SPECIMENS: Placenta sent to pathology COMPLICATIONS: None immediate  PROCEDURE IN DETAIL:  The patient preoperatively received intravenous antibiotics and had sequential compression devices applied to her lower extremities.  She was then taken to the operating room where the epidural anesthesia was dosed up to surgical level and was found to be adequate. She was then placed in a dorsal supine position with a leftward tilt, and prepped and draped in a sterile manner.  A foley catheter was placed into her bladder and attached to constant gravity.  After an adequate timeout was performed, a Pfannenstiel skin incision was made with scalpel two fingerbreaths above the pubic symphysis on her preexisting scar and carried through to the underlying layer of fascia. The fascia was incised in the midline, and this incision was extended bilaterally using the Mayo scissors.  Kocher clamps x 2 were applied to the superior aspect of the fascial incision and the underlying rectus muscles were dissected off bluntly and sharply.  A similar process was carried out on the inferior aspect of the fascial incision. The rectus muscles were separated in the midline and severe adhesive disease was noted.  Dr. Alysia Penna was called in at this time to aid in freeing adhesions and continuing the dissection.  Fundal adhesions to the anterior abdominal wall were noted.Omental adhesions were noted as well.  The bladder blade and Richardson retractor were introduced into the abdominal cavity.  The upper uterus appeared to be distended prior to the uterine incision.The vesicouterine peritoneum was identified and grasped using smooth pickups.  It was incised and extended laterally using the Metzenbaum scissors.  A bladder flap was then digitally created.  Attention was turned to the lower uterine segment where a low transverse hysterotomy was made with a scalpel and extended bilaterally  bluntly.  The infant was successfully delivered, the cord was clamped and cut after 30 seconds, and the infant was handed over to the awaiting neonatology team. Uterine massage was then administered, and the placenta delivered intact with a three-vessel cord. The uterus was then cleared of clots and debris using manual curettage.  The uterine incision was closed with 0 vicryl  in a running locked fashion. Figure-of-eight 0 Vicryl serosal stitches were placed to help with hemostasis.  The pelvis was cleared of all clot and debris with irrigation and suction. Small bleeders were cauterized.  Arista was placed over the uterine incision and other raw surfaces.  Hemostasis was confirmed on all surfaces.  The retractor was removed.  The peritoneum was closed with a 2-0 Vicryl running stitch and the rectus muscles were reapproximated using 0 Vicryl interrupted stitches. The fascia was then closed using 0 Vicryl in a running fashion.  The subcutaneous layer was irrigated, reapproximated with 2-0 vicryl interrupted stitches, and the skin was closed with a 4-0 Vicryl subcuticular stitch. The patient tolerated the procedure well. Sponge, instrument and needle counts were correct x 3.  She was taken to the recovery room in stable condition. MD spoke to both parents and informed them of severe adhesive disease and the need for two obstetrics surgeons at the next cesarean section or other procedure.   Mariel Aloe, MD, FACOG Obstetrician & Gynecologist, [facility] for [facility], [facility]

## 2020-11-12 ENCOUNTER — Encounter (HOSPITAL_COMMUNITY): Payer: Self-pay | Admitting: Obstetrics and Gynecology

## 2020-11-12 LAB — CBC
HCT: 28.1 % — ABNORMAL LOW (ref 36.0–46.0)
Hemoglobin: 9.7 g/dL — ABNORMAL LOW (ref 12.0–15.0)
MCH: 32.1 pg (ref 26.0–34.0)
MCHC: 34.5 g/dL (ref 30.0–36.0)
MCV: 93 fL (ref 80.0–100.0)
Platelets: 173 10*3/uL (ref 150–400)
RBC: 3.02 MIL/uL — ABNORMAL LOW (ref 3.87–5.11)
RDW: 14.3 % (ref 11.5–15.5)
WBC: 17.2 10*3/uL — ABNORMAL HIGH (ref 4.0–10.5)
nRBC: 0 % (ref 0.0–0.2)

## 2020-11-12 MED ORDER — SODIUM CHLORIDE 0.9 % IV SOLN
500.0000 mg | Freq: Once | INTRAVENOUS | Status: AC
  Administered 2020-11-12: 500 mg via INTRAVENOUS
  Filled 2020-11-12: qty 25

## 2020-11-12 NOTE — Lactation Note (Signed)
This note was copied from a baby's chart. Lactation Consultation Note  Patient Name: Alexandria Rogers [id number] Date: 11/12/2020 Reason for consult: Initial assessment Age:33 hours  Mother given [facility] brochure and basic teaching done. Mother reports that she breastfed a child 2 months that is now 90 yrs old. Mother reports that infant has breastfed for short feedings 3 times.   Assist mother with hand expression. Observed colostrum. Mother has a hand pump that staff nurse gave her. She was fit with a #24 flange.  Mother advised to page staff nurse or [facility] when attempting to latch infant again. Discussed importance of STS and cue feeding.  Discussed second night of life and cluster feeding. Mother advised to breastfeed infant before supplementing with formula.  Mother has a DEBP at home.   Maternal Data Has patient been taught Hand Expression?: Yes Does the patient have breastfeeding experience prior to this delivery?: Yes How long did the patient breastfeed?: 2 months  Feeding Mother's Current Feeding Choice: Breast Milk and Formula Nipple Type: Slow - flow  LATCH Score                    Lactation Tools Discussed/Used Tools: Pump;Flanges Flange Size: 24 Breast pump type: Manual  Interventions Interventions: Hand express;Position options;Hand pump  Discharge    Consult Status Consult Status: Follow-up Date: 11/12/20 Follow-up type: In-patient    Stevan Born [facility] 11/12/2020, 4:04 PM

## 2020-11-12 NOTE — Progress Notes (Signed)
POSTPARTUM PROGRESS NOTE  POD #1  Subjective:  Alexandria Rogers is a 35 y.o. G2P2002 s/p rLTCS at [redacted]w[redacted]d.  She reports she doing well. No acute events overnight. She reports she is doing well and processing her surgery well. She denies any problems with ambulating, voiding or po intake. Denies nausea or vomiting. She has not passed flatus. Pain is well controlled.  Lochia is moderate. She denies headaches, blurry vision, chest pain or SOB. She does endorse some fatigue but has not had lightheadedness while ambulating  Objective: Blood pressure (!) 112/53, pulse 62, temperature 98 F (36.7 C), resp. rate 18, height 5' 7" (1.702 m), weight 130.6 kg, last menstrual period 02/08/2020, SpO2 100 %, unknown if currently breastfeeding.  Physical Exam:  General: alert, cooperative and no distress Chest: no respiratory distress Heart:regular rate, distal pulses intact Abdomen: soft, nontender,  Uterine Fundus: firm, appropriately tender DVT Evaluation: No calf swelling or tenderness Extremities: no edema Skin: warm, dry; incision clean/dry/intact w/ pressure dressing in place  Recent Labs    11/11/20 0931 11/12/20 0414  HGB 12.2 9.7*  HCT 35.4* 28.1*    Assessment/Plan: Alexandria Rogers is a 35 y.o. G2P2002 s/p rLTCS at [redacted]w[redacted]d for Fetal intolerance of labor.  POD#1 - Doing welll; pain well controlled. H/H appropriate  Routine postpartum care  OOB, ambulated  Lovenox for VTE prophylaxis Anemia: mildly symptomatic  Start po ferrous sulfate BID 8/7  IV venofer today Elevated BP during induction, has not met criteria for hypertensive diagnosis with negative Pre E labs and asymptomatic Contraception: plans for NFP and condoms Feeding: Breast and bottle  Dispo: Plan for discharge POD2-3.   LOS: 2 days   Annabelle Kraus MD  11/12/2020, 7:49 AM  

## 2020-11-12 NOTE — Progress Notes (Signed)
B/P 95/55 and taken x 2 in left arm. Patient stopped her IV. Denies dizziness. IV restarted with Venofer , Patient encouraged to drink PO fluids and breakfast also given. Patient also encouraged not to get OOB without nurse present, verbalized understanding

## 2020-11-12 NOTE — Progress Notes (Signed)
Today's blood pressures  95/55 and 106/48 reviewed with  Dr Dorma Russell rounding, no new order received. Patient to continue to increase PO fluids. States she feels fatigued but denies dizziness.  MD stated if B/P 's remain low this pm, call for a possible 500 ml bolus

## 2020-11-12 NOTE — Lactation Note (Signed)
This note was copied from a baby's chart. Lactation Consultation Note  Patient Name: Alexandria Rogers [id number] Date: 11/12/2020   Age:33 hours LC services request added when infant was 13 hours of life.  Per RN, mom states she wants to be seen by LC. Mom's current feeding choice is breast and formula. LC entered room, per mom, she is tired and would like to be seen by [facility] services in the morning., mom plans to formula feed infant only tonight and breastfeed infant in the morning.  Mom knows to feeding infant  by cues, 8 to 12 times within 24 hours. Maternal Data    Feeding    LATCH Score              Lactation Tools Discussed/Used    Interventions    Discharge    Consult Status      Danelle Earthly 11/12/2020, 3:19 AM

## 2020-11-13 MED ORDER — OXYCODONE-ACETAMINOPHEN 5-325 MG PO TABS: 1.0000 | ORAL_TABLET | Freq: Four times a day (QID) | ORAL | 0 refills | Status: DC | PRN

## 2020-11-13 MED ORDER — IBUPROFEN 600 MG PO TABS: 600.0000 mg | ORAL_TABLET | Freq: Four times a day (QID) | ORAL | 0 refills | Status: DC

## 2020-11-13 NOTE — Lactation Note (Signed)
This note was copied from a baby's chart. Lactation Consultation Note  Patient Name: Alexandria Rogers [id number] Date: 11/13/2020   Age:33 hours LC spoke with [name] over the phone, to set up a time to observe feeding .  Mother reports that she just pumped 6 mls.  Mother reports that she has been discharged and is getting ready to leave. LC did consult over the phone.  Discussed pumping schedule. Advised to continue to pump every 3 hours for 15 - 20 mins . Mother if ok to use anything on her nipples . Suggested hand express milk and coconut oil. Mother reports that infant is latching better. LC suggested to follow with OP LC, mother agreeable. Will send message through Epic.  Discussed treatment and prevention of engorgement. Mother is aware available resources as needed.   Maternal Data    Feeding Nipple Type: Slow - flow  LATCH Score                    Lactation Tools Discussed/Used Tools: Pump Flange Size: 24 Breast pump type: Double-Electric Breast Pump  Interventions    Discharge    Consult Status      Alexandria Rogers 11/13/2020, 1:29 PM

## 2020-11-13 NOTE — Progress Notes (Signed)
Mom instructed on electric breast pump usage, cleaning ,and storage of breast milk. Pumped for 20 minutes with 5 ml colostrum return. Mom also states that she has electric breast pump at home

## 2020-11-15 LAB — SURGICAL PATHOLOGY

## 2020-11-17 ENCOUNTER — Encounter (HOSPITAL_COMMUNITY): Payer: Self-pay | Admitting: Obstetrics & Gynecology

## 2020-11-17 ENCOUNTER — Ambulatory Visit (INDEPENDENT_AMBULATORY_CARE_PROVIDER_SITE_OTHER): Payer: 59 | Admitting: *Deleted

## 2020-11-17 ENCOUNTER — Other Ambulatory Visit: Payer: Self-pay

## 2020-11-17 ENCOUNTER — Inpatient Hospital Stay (HOSPITAL_COMMUNITY): Payer: 59

## 2020-11-17 ENCOUNTER — Inpatient Hospital Stay (HOSPITAL_COMMUNITY)
Admission: AD | Admit: 2020-11-17 | Discharge: 2020-11-17 | Disposition: A | Discharge status: Home / Self Care | Payer: 59 | Attending: Obstetrics & Gynecology | Admitting: Obstetrics & Gynecology

## 2020-11-17 DIAGNOSIS — R079 Chest pain, unspecified: Secondary | ICD-10-CM | POA: Insufficient documentation

## 2020-11-17 DIAGNOSIS — R0789 Other chest pain: Secondary | ICD-10-CM | POA: Diagnosis not present

## 2020-11-17 DIAGNOSIS — O99893 Other specified diseases and conditions complicating puerperium: Secondary | ICD-10-CM | POA: Diagnosis not present

## 2020-11-17 DIAGNOSIS — O165 Unspecified maternal hypertension, complicating the puerperium: Secondary | ICD-10-CM | POA: Diagnosis not present

## 2020-11-17 DIAGNOSIS — R030 Elevated blood-pressure reading, without diagnosis of hypertension: Secondary | ICD-10-CM | POA: Diagnosis present

## 2020-11-17 LAB — COMPREHENSIVE METABOLIC PANEL
ALT: 38 U/L (ref 0–44)
AST: 35 U/L (ref 15–41)
Albumin: 2.8 g/dL — ABNORMAL LOW (ref 3.5–5.0)
Alkaline Phosphatase: 72 U/L (ref 38–126)
Anion gap: 6 (ref 5–15)
BUN: 11 mg/dL (ref 6–20)
CO2: 26 mmol/L (ref 22–32)
Calcium: 9.3 mg/dL (ref 8.9–10.3)
Chloride: 106 mmol/L (ref 98–111)
Creatinine, Ser: 0.7 mg/dL (ref 0.44–1.00)
GFR, Estimated: >60 mL/min (ref >60)
Glucose, Bld: 83 mg/dL (ref 70–99)
Potassium: 3.8 mmol/L (ref 3.5–5.1)
Sodium: 138 mmol/L (ref 135–145)
Total Bilirubin: 0.4 mg/dL (ref 0.3–1.2)
Total Protein: 6.2 g/dL — ABNORMAL LOW (ref 6.5–8.1)

## 2020-11-17 LAB — URINALYSIS, ROUTINE W REFLEX MICROSCOPIC
Bilirubin Urine: NEGATIVE
Glucose, UA: NEGATIVE mg/dL
Ketones, ur: NEGATIVE mg/dL
Nitrite: NEGATIVE
Protein, ur: NEGATIVE mg/dL
Specific Gravity, Urine: 1.011 (ref 1.005–1.030)
pH: 7 (ref 5.0–8.0)

## 2020-11-17 LAB — BRAIN NATRIURETIC PEPTIDE: B Natriuretic Peptide: 147.1 pg/mL — ABNORMAL HIGH (ref 0.0–100.0)

## 2020-11-17 LAB — CBC
HCT: 31.8 % — ABNORMAL LOW (ref 36.0–46.0)
Hemoglobin: 10.7 g/dL — ABNORMAL LOW (ref 12.0–15.0)
MCH: 31.8 pg (ref 26.0–34.0)
MCHC: 33.6 g/dL (ref 30.0–36.0)
MCV: 94.4 fL (ref 80.0–100.0)
Platelets: 235 10*3/uL (ref 150–400)
RBC: 3.37 MIL/uL — ABNORMAL LOW (ref 3.87–5.11)
RDW: 14.6 % (ref 11.5–15.5)
WBC: 7.3 10*3/uL (ref 4.0–10.5)
nRBC: 0 % (ref 0.0–0.2)

## 2020-11-17 LAB — TROPONIN I (HIGH SENSITIVITY): Troponin I (High Sensitivity): 8 ng/L (ref <18)

## 2020-11-17 MED ORDER — OXYCODONE-ACETAMINOPHEN 5-325 MG PO TABS
1.0000 | ORAL_TABLET | Freq: Once | ORAL | Status: AC
  Administered 2020-11-17: 1 via ORAL
  Filled 2020-11-17: qty 1

## 2020-11-17 MED ORDER — IBUPROFEN 600 MG PO TABS
600.0000 mg | ORAL_TABLET | Freq: Once | ORAL | Status: AC
  Administered 2020-11-17: 600 mg via ORAL
  Filled 2020-11-17: qty 1

## 2020-11-17 MED ORDER — NIFEDIPINE ER OSMOTIC RELEASE 30 MG PO TB24: 30.0000 mg | ORAL_TABLET | Freq: Every day | ORAL | 0 refills | Status: DC

## 2020-11-17 NOTE — Progress Notes (Signed)
Subjective:     Zondra Lawlor is a 35 y.o. female who presents to the clinic 1 weeks status post low uterine, transverse cesarean section. Pt reports incision is covered with honeycomb. Pt also reports she has had some elevated BP's at home (150's/90's) and has a HA unresolved with meds. Has swelling and overall not well feeling .     Objective:    BP (!) 162/102   Pulse 63  General:  in no apparent distress  Incision:   healing well, no drainage, no erythema, no hernia, no seroma, no swelling, no dehiscence, incision well approximated. Honey comb and steri strips removed.     Assessment:    Pt needs further evaluation for BP's. Discussed with Dr Vergie Living and pt is to go to MAU. Plan:    1. Go to [facility] for BP evaluation. 2.Continue any current medications. 2. Wound care discussed. 3. Follow up: as needed or at postpartum visit.   Scheryl Marten, RN

## 2020-11-17 NOTE — MAU Provider Note (Signed)
Chief Complaint:  Hypertension   Event Date/Time   First Provider Initiated Contact with Patient 11/17/20 1547     HPI: Alexandria Rogers is a 35 y.o. G2P2002 on PPD#5 s/p rCS who presents to maternity admissions reporting elevated blood pressure at home (150/90s). She was seen in the office for an incision check and had a BP of 162/102 so was sent here for evaluation.  Has had a headache x2 days that has been unresponsive to Tylenol or oxycodone. Earlier today she took a Goody powder and that reduced the headache from a 8/10 migraine to a 2/10. Experiencing some occasional chest pain on the right side above her breast with concurrent SOB. Not constant, resolved spontaneously, none now. Denies cough, dyspnea, hemoptysis, epigastric or flank pain, visual disturbances or dizziness. No other physical complaints.  Pregnancy Course: Uncomplicated pregnancy, attempted TOLAC but had a rCS for NRFHT.  Past Medical History:  Diagnosis Date   Asthma    OB History  Gravida Para Term Preterm AB Living  2 2 2     2   SAB IAB Ectopic Multiple Live Births        0 2    # Outcome Date GA Lbr Len/2nd Weight Sex Delivery Anes PTL Lv  2 Term 11/11/20 [redacted]w[redacted]d  7 lb 1.9 oz (3.23 kg) F CS-LTranv EPI  LIV  1 Term 08/02/06 [redacted]w[redacted]d  9 lb 5 oz (4.224 kg) M CS-LTranv EPI  LIV   Past Surgical History:  Procedure Laterality Date   CESAREAN SECTION     CESAREAN SECTION  11/11/2020   Procedure: CESAREAN SECTION;  Surgeon: 01/11/2021, MD;  Location: [facility] LD ORS;  Service: Obstetrics;;   WISDOM TOOTH EXTRACTION     Family History  Problem Relation Age of Onset   Heart disease Father    Alcohol abuse Father    Lung cancer Maternal Grandmother    Diabetes Paternal Grandmother    Social History   Tobacco Use   Smoking status: Never   Smokeless tobacco: Never  Vaping Use   Vaping Use: Never used  Substance Use Topics   Alcohol use: Not Currently    Comment: once or twice a week   Drug use: No   No Known  Allergies Medications Prior to Admission  Medication Sig Dispense Refill Last Dose   calcium carbonate (TUMS - DOSED IN MG ELEMENTAL CALCIUM) 500 MG chewable tablet Chew 1 tablet by mouth daily.   Past Month   ibuprofen (ADVIL) 600 MG tablet Take 1 tablet (600 mg total) by mouth every 6 (six) hours. 30 tablet 0 11/17/2020 at 1030   oxyCODONE-acetaminophen (PERCOCET/ROXICET) 5-325 MG tablet Take 1 tablet by mouth every 6 (six) hours as needed for severe pain. 20 tablet 0 11/17/2020 at 1030   Prenatal Vit-Fe Fumarate-FA (PRENATAL MULTIVITAMIN) TABS tablet Take 1 tablet by mouth daily at 12 noon.   11/17/2020 at 1030   albuterol (PROVENTIL HFA;VENTOLIN HFA) 108 (90 BASE) MCG/ACT inhaler Inhale 2 puffs into the lungs every 6 (six) hours as needed for wheezing.   More than a month   I have reviewed patient's Past Medical Hx, Surgical Hx, Family Hx, Social Hx, medications and allergies.   ROS:  Review of Systems  Constitutional:  Negative for fever.  HENT:  Negative for congestion.   Eyes:  Negative for visual disturbance.  Respiratory:  Positive for shortness of breath. Negative for cough.   Cardiovascular:  Positive for chest pain.  Gastrointestinal:  Negative for nausea  and vomiting.  Neurological:  Positive for headaches. Negative for dizziness and syncope.   Physical Exam  Patient Vitals for the past 24 hrs:  BP Temp Temp src Pulse Resp SpO2  11/17/20 1926 -- -- -- 63 -- --  11/17/20 1831 -- -- -- (!) 58 -- 100 %  11/17/20 1744 (!) 141/83 -- -- (!) 54 17 --  11/17/20 1515 134/84 -- -- (!) 59 18 100 %  11/17/20 1450 138/88 98.2 F (36.8 C) Oral 60 17 99 %   Constitutional: Well-developed, well-nourished female in no acute distress.  Cardiovascular: normal heart sounds, bradycardic at times but normal rhythm via auscultation Respiratory: normal effort, lung sounds clear throughout GI: Abd soft, non-tender, Pos BS x 4 MS: Extremities nontender, no edema, normal ROM Neurologic: Alert and  oriented x 4.  GU: no CVA tenderness Pelvic exam deferred  Labs: Results for orders placed or performed during the hospital encounter of 11/17/20 (from the past 24 hour(s))  Urinalysis, Routine w reflex microscopic     Status: Abnormal   Collection Time: 11/17/20  3:24 PM  Result Value Ref Range   Color, Urine YELLOW YELLOW   APPearance HAZY (A) CLEAR   Specific Gravity, Urine 1.011 1.005 - 1.030   pH 7.0 5.0 - 8.0   Glucose, UA NEGATIVE NEGATIVE mg/dL   Hgb urine dipstick MODERATE (A) NEGATIVE   Bilirubin Urine NEGATIVE NEGATIVE   Ketones, ur NEGATIVE NEGATIVE mg/dL   Protein, ur NEGATIVE NEGATIVE mg/dL   Nitrite NEGATIVE NEGATIVE   Leukocytes,Ua SMALL (A) NEGATIVE   RBC / HPF 11-20 0 - 5 RBC/hpf   WBC, UA 6-10 0 - 5 WBC/hpf   Bacteria, UA RARE (A) NONE SEEN   Squamous Epithelial / LPF 6-10 0 - 5   Mucus PRESENT   CBC     Status: Abnormal   Collection Time: 11/17/20  3:56 PM  Result Value Ref Range   WBC 7.3 4.0 - 10.5 K/uL   RBC 3.37 (L) 3.87 - 5.11 MIL/uL   Hemoglobin 10.7 (L) 12.0 - 15.0 g/dL   HCT 53.2 (L) 02.3 - 34.3 %   MCV 94.4 80.0 - 100.0 fL   MCH 31.8 26.0 - 34.0 pg   MCHC 33.6 30.0 - 36.0 g/dL   RDW 56.8 61.6 - 83.7 %   Platelets 235 150 - 400 K/uL   nRBC 0.0 0.0 - 0.2 %  Comprehensive metabolic panel     Status: Abnormal   Collection Time: 11/17/20  3:56 PM  Result Value Ref Range   Sodium 138 135 - 145 mmol/L   Potassium 3.8 3.5 - 5.1 mmol/L   Chloride 106 98 - 111 mmol/L   CO2 26 22 - 32 mmol/L   Glucose, Bld 83 70 - 99 mg/dL   BUN 11 6 - 20 mg/dL   Creatinine, Ser 2.90 0.44 - 1.00 mg/dL   Calcium 9.3 8.9 - 21.1 mg/dL   Total Protein 6.2 (L) 6.5 - 8.1 g/dL   Albumin 2.8 (L) 3.5 - 5.0 g/dL   AST 35 15 - 41 U/L   ALT 38 0 - 44 U/L   Alkaline Phosphatase 72 38 - 126 U/L   Total Bilirubin 0.4 0.3 - 1.2 mg/dL   GFR, Estimated >15 >52 mL/min   Anion gap 6 5 - 15  Brain natriuretic peptide     Status: Abnormal   Collection Time: 11/17/20  3:56 PM   Result Value Ref Range   B Natriuretic Peptide 147.1 (H)  0.0 - 100.0 pg/mL  Troponin I (High Sensitivity)     Status: None   Collection Time: 11/17/20  4:23 PM  Result Value Ref Range   Troponin I (High Sensitivity) 8 <18 ng/L   Imaging:  DG CHEST PORT 1 VIEW  Result Date: 11/17/2020 CLINICAL DATA:  Chest pain EXAM: PORTABLE CHEST 1 VIEW COMPARISON:  None. FINDINGS: The heart size and mediastinal contours are within normal limits. Both lungs are clear. The visualized skeletal structures are unremarkable. IMPRESSION: Normal study. Electronically Signed   By: Charlett Nose M.D.   On: 11/17/2020 17:45    EKG: sinus bradycardia with sinus arrhythmia Consult called to cardiology, spoke to Dr. Thedore Mins (sp?) who read EKG and said it was normal and arrhythmia likely due to high vagal tone. No need for immediate echo or cardiac follow up.  MAU Course: Orders Placed This Encounter  Procedures   DG CHEST PORT 1 VIEW   CBC   Comprehensive metabolic panel   Urinalysis, Routine w reflex microscopic   Brain natriuretic peptide   EKG 12-Lead   Discharge patient   Meds ordered this encounter  Medications   oxyCODONE-acetaminophen (PERCOCET/ROXICET) 5-325 MG per tablet 1 tablet   ibuprofen (ADVIL) tablet 600 mg   NIFEdipine (PROCARDIA XL) 30 MG 24 hr tablet    Sig: Take 1 tablet (30 mg total) by mouth daily.    Dispense:  30 tablet    Refill:  0    Order Specific Question:   Supervising Provider    Answer:   Samara Snide   MDM: Preeclampsia workup normal, but pt had another elevated blood pressure. Pressures in MAU meet criteria for medication. Will send pt home on daily procardia.  EKG deemed normal, got BNP/troponin to completely rule out cardiac issues. BNP elevated so consulted Dr. Crissie Reese who evaluated patient and agreed she was ok to discharge and follow up in office for BP in one week.  Explained findings to patient and discussed postpartum recovery self-care to help her body  recover from surgical birth and breastfeeding. Pt verbalized appreciation and understanding.  Assessment: 1. Postpartum hypertension   2. Chest pain at rest   3. Chest pain, unspecified type     Plan: Discharge home in stable condition with return precautions    Follow-up Information     [facility] for [facility] Follow up in 1 week(s).   Specialty: Obstetrics and Gynecology Why: for BP check Contact information: [address] [place] [address] [phone]                Allergies as of 11/17/2020   No Known Allergies      Medication List     TAKE these medications    albuterol 108 (90 Base) MCG/ACT inhaler Commonly known as: VENTOLIN HFA Inhale 2 puffs into the lungs every 6 (six) hours as needed for wheezing.   calcium carbonate 500 MG chewable tablet Commonly known as: TUMS - dosed in mg elemental calcium Chew 1 tablet by mouth daily.   ibuprofen 600 MG tablet Commonly known as: ADVIL Take 1 tablet (600 mg total) by mouth every 6 (six) hours.   NIFEdipine 30 MG 24 hr tablet Commonly known as: Procardia XL Take 1 tablet (30 mg total) by mouth daily.   oxyCODONE-acetaminophen 5-325 MG tablet Commonly known as: PERCOCET/ROXICET Take 1 tablet by mouth every 6 (six) hours as needed for severe pain.   prenatal multivitamin Tabs tablet Take  1 tablet by mouth daily at 12 noon.        Edd ArbourJamilla Adena Sima, CNM, MSN, IBCLC Certified Nurse Midwife, [facility]

## 2020-11-17 NOTE — MAU Note (Signed)
.  Alexandria Rogers is a 35 y.o. postpartum c/s here in MAU reporting: severe range BP in the office earlier today. Also having headache she rates 2/10. She has taken a goody powder for her headache and is taking her pain medication from her c/s. Has swelling in BLE. Denies epigastric pain.   Pain score:  Vitals:   11/17/20 1450  BP: 138/88  Pulse: 60  Resp: 17  Temp: 98.2 F (36.8 C)  SpO2: 99%

## 2020-11-18 ENCOUNTER — Encounter (HOSPITAL_COMMUNITY): Payer: Self-pay | Admitting: *Deleted

## 2020-11-21 ENCOUNTER — Other Ambulatory Visit: Payer: Self-pay | Admitting: *Deleted

## 2020-11-21 MED ORDER — SILVER SULFADIAZINE 1 % EX CREA: 1.0000 "application " | TOPICAL_CREAM | Freq: Every day | CUTANEOUS | 0 refills | Status: DC

## 2020-11-23 ENCOUNTER — Telehealth (HOSPITAL_COMMUNITY): Payer: Self-pay

## 2020-11-23 NOTE — Telephone Encounter (Signed)
"  I'm doing good. We have an appointment with for a nurse home visit next on August 24th. I had my incision check with my doctor. They said my incision looks good. I had a reaction to the tape so I have some tape blisters. I'm not sure if i should pop them?" RN advised patien to not pop the tape blisters but to just let them heal. "I have been doing some walking." Patient has no other questions or concerns about her healing.  "Baby is doing good. We have a lactation appointment on Aug 25th with the [facility] lactation consultant. We are supplementing with some formula and I would like for baby to just get breastmilk. Sometimes I have some trouble with latching. I'm pumping as well. I'm hoping to get some good pointers at the appointment." RN encouraged patient to keep the appointment with lactation. Patient asking about cord care. RN reviewed cord care with patient. Baby sleeps in a pack n play bassinet downstairs and upstairs baby sleeps in a bassinet next to our bed." RN reviewed ABC's of safe sleep with patient. Patient declines any questions or concerns about baby.  EPDS score is 1.  Marcelino Duster [facility] 11/23/2020,1954

## 2020-11-23 NOTE — Progress Notes (Signed)
Patient was assessed and managed by nursing staff during this encounter. I have reviewed the chart and agree with the documentation and plan. I have also made any necessary editorial changes.  Chukwuma Straus, MD 11/23/2020 9:21 AM   

## 2020-12-20 ENCOUNTER — Other Ambulatory Visit: Payer: Self-pay

## 2020-12-20 ENCOUNTER — Ambulatory Visit (INDEPENDENT_AMBULATORY_CARE_PROVIDER_SITE_OTHER): Payer: 59 | Admitting: Obstetrics & Gynecology

## 2020-12-20 ENCOUNTER — Encounter: Payer: Self-pay | Admitting: Obstetrics & Gynecology

## 2020-12-20 DIAGNOSIS — Z23 Encounter for immunization: Secondary | ICD-10-CM | POA: Diagnosis not present

## 2020-12-20 NOTE — Progress Notes (Signed)
Post Partum Visit Note  Alexandria Rogers is a 35 y.o. G51P2002 female who presents for a postpartum visit. She is 5 weeks postpartum following a repeat cesarean section.  I have fully reviewed the prenatal and intrapartum course. The delivery was at 39.4 gestational weeks.  Anesthesia: epidural. Postpartum course has been . Baby is doing well. Baby is feeding by both breast and bottle - Gerber life . Bleeding no bleeding. Bowel function is normal. Bladder function is normal. Patient is sexually active. Contraception method is none. Postpartum depression screening: negative.   The pregnancy intention screening data noted above was reviewed. Potential methods of contraception were discussed. The patient is undecided for now.   Edinburgh Postnatal Depression Scale - 12/20/20 1454       Edinburgh Postnatal Depression Scale:  In the Past 7 Days   I have been able to laugh and see the funny side of things. 0    I have looked forward with enjoyment to things. 0    I have blamed myself unnecessarily when things went wrong. 0    I have been anxious or worried for no good reason. 2    I have felt scared or panicky for no good reason. 0    Things have been getting on top of me. 0    I have been so unhappy that I have had difficulty sleeping. 0    I have felt sad or miserable. 0    I have been so unhappy that I have been crying. 0    The thought of harming myself has occurred to me. 0    Edinburgh Postnatal Depression Scale Total 2             Health Maintenance Due  Topic Date Due   COVID-19 Vaccine (1) Never done   INFLUENZA VACCINE  Never done   PAP SMEAR-Modifier  02/13/2021    The following portions of the patient's history were reviewed and updated as appropriate: allergies, current medications, past family history, past medical history, past social history, past surgical history, and problem list.  Review of Systems Pertinent items noted in HPI and remainder of  comprehensive ROS otherwise negative.  Objective:  BP 113/66   Pulse 80   Wt 263 lb (119.3 kg)   LMP 02/08/2020 (Exact Date)   BMI 41.19 kg/m    General:  alert and no distress   Breasts:  normal  Lungs: clear to auscultation bilaterally  Heart:  regular rate and rhythm  Abdomen: soft, non-tender; bowel sounds normal; no masses,  no organomegaly   Wound well approximated incision, no erythema, no induration, no drainage  GU exam:  not indicated       Assessment:   Postpartum care following cesarean delivery  Plan:   Essential components of care per ACOG recommendations:  1.  Mood and well being: Patient with negative depression screening today. Reviewed local resources for support.  - Patient tobacco use? No.   - hx of drug use? No.    2. Infant care and feeding:  -Patient currently breastmilk feeding? Yes. Discussed returning to work and pumping. Reviewed importance of draining breast regularly to support lactation.  -Social determinants of health (SDOH) reviewed in EPIC. No concerns.  3. Sexuality, contraception and birth spacing - Patient does not want a pregnancy in the next year.  Desired family size is 3 children.  - Reviewed forms of contraception in tiered fashion. Patient desired condoms, natural family planning (NFP) for now,  will discuss with husband. - Discussed birth spacing of 18 months  4. Sleep and fatigue -Encouraged family/partner/community support of 4 hrs of uninterrupted sleep to help with mood and fatigue  5. Physical Recovery  - Discussed patients delivery and complications.  - Patient had a C-section, no problems at delivery.  Patient expressed understanding - Patient has urinary incontinence? No. - Patient is safe to resume physical and sexual activity  6.  Health Maintenance - HM due items addressed Yes - Pap smear due in 02/2021, will be scheduled. Normal pap in 02/2018.  7. Flu vaccine need, this was given today. - Flu Vaccine QUAD 36+  mos IM (Fluarix, Quad PF)    Jaynie Collins, MD Center for [employer], [facility]

## 2021-07-05 ENCOUNTER — Emergency Department
Admission: EM | Admit: 2021-07-05 | Discharge: 2021-07-05 | Disposition: A | Discharge status: Home / Self Care | Payer: No Typology Code available for payment source | Attending: Emergency Medicine | Admitting: Emergency Medicine

## 2021-07-05 ENCOUNTER — Emergency Department: Payer: No Typology Code available for payment source

## 2021-07-05 ENCOUNTER — Other Ambulatory Visit: Payer: Self-pay

## 2021-07-05 DIAGNOSIS — Y92410 Unspecified street and highway as the place of occurrence of the external cause: Secondary | ICD-10-CM | POA: Diagnosis not present

## 2021-07-05 DIAGNOSIS — S161XXA Strain of muscle, fascia and tendon at neck level, initial encounter: Secondary | ICD-10-CM | POA: Insufficient documentation

## 2021-07-05 DIAGNOSIS — J45909 Unspecified asthma, uncomplicated: Secondary | ICD-10-CM | POA: Diagnosis not present

## 2021-07-05 DIAGNOSIS — S199XXA Unspecified injury of neck, initial encounter: Secondary | ICD-10-CM | POA: Diagnosis present

## 2021-07-05 MED ORDER — BACLOFEN 10 MG PO TABS: 10.0000 mg | ORAL_TABLET | Freq: Three times a day (TID) | ORAL | 0 refills | Status: AC

## 2021-07-05 MED ORDER — MELOXICAM 15 MG PO TABS: 15.0000 mg | ORAL_TABLET | Freq: Every day | ORAL | 2 refills | Status: DC

## 2021-07-05 NOTE — ED Notes (Signed)
Back from X/R.

## 2021-07-05 NOTE — ED Notes (Signed)
See triage note. Pt ambulatory to ED room, NAD. ?
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
## 2021-07-05 NOTE — Discharge Instructions (Signed)
Use wet heat and ice to your neck.  Apply the wet heat on the muscle, I should be applied to the bony prominences.  Take meloxicam and baclofen as prescribed.  Follow-up with orthopedics if you are not improving in 1 week.  They may need to do an MRI or send you to physical therapy. ?

## 2021-07-05 NOTE — ED Notes (Signed)
Patient transported to X-ray 
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
## 2021-07-19 ENCOUNTER — Other Ambulatory Visit (HOSPITAL_COMMUNITY)
Admission: RE | Admit: 2021-07-19 | Discharge: 2021-07-19 | Disposition: A | Discharge status: Home / Self Care | Payer: 59 | Source: Ambulatory Visit | Attending: Obstetrics and Gynecology | Admitting: Obstetrics and Gynecology

## 2021-07-19 ENCOUNTER — Encounter: Payer: Self-pay | Admitting: Obstetrics and Gynecology

## 2021-07-19 ENCOUNTER — Ambulatory Visit (INDEPENDENT_AMBULATORY_CARE_PROVIDER_SITE_OTHER): Payer: 59 | Admitting: Obstetrics and Gynecology

## 2021-07-19 VITALS — BP 137/82 | HR 73 | Ht 67.0 in | Wt 277.0 lb

## 2021-07-19 DIAGNOSIS — Z01419 Encounter for gynecological examination (general) (routine) without abnormal findings: Secondary | ICD-10-CM

## 2021-07-19 DIAGNOSIS — N736 Female pelvic peritoneal adhesions (postinfective): Secondary | ICD-10-CM | POA: Insufficient documentation

## 2021-07-19 DIAGNOSIS — Z6841 Body Mass Index (BMI) 40.0 and over, adult: Secondary | ICD-10-CM

## 2021-07-19 MED ORDER — SLYND 4 MG PO TABS: 1.0000 | ORAL_TABLET | Freq: Every day | ORAL | 3 refills | Status: DC

## 2021-07-19 NOTE — Progress Notes (Signed)
Obstetrics and Gynecology ?New Patient Evaluation ? ?Appointment Date: 07/19/2021 ? ?OBGYN Clinic: [facility] ? ?Primary Care Provider: [facility] ? ? ?Chief Complaint:  Annual exam ? ? ?History of Present Illness: Alexandria Rogers is a 36 y.o. [phone] seen for the above chief complaint. Her past medical history is significant for BMI 40s, pelvic adhesive disease, c-section x 2 ? ?Patient is interested in birth control; she had a recent elective termination in late 2022.  ? ?Review of Systems: Pertinent items noted in HPI and remainder of comprehensive ROS otherwise negative.  ? ? ?Patient Active Problem List  ? Diagnosis Date Noted  ? 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 BMI 40.0-44.9, adult (HCC) 07/19/2021  ? ? ?Past Medical History:  ?Past Medical History:  ?Diagnosis Date  ? Asthma   ? Exercise-induced asthma 12/24/2012  ? ? ?Past Surgical History:  ?Past Surgical History:  ?Procedure Laterality Date  ? CESAREAN SECTION    ? CESAREAN SECTION  11/11/2020  ? Procedure: CESAREAN SECTION;  Surgeon: Warden Fillers, MD;  Location: [facility] LD ORS;  Service: Obstetrics;;  ? WISDOM TOOTH EXTRACTION    ? ? ?Past Obstetrical History:  ?OB History  ?Gravida Para Term Preterm AB Living  ?3 2 2   1 2   ?SAB IAB Ectopic Multiple Live Births  ?  1   0 2  ?  ?# Outcome Date GA Lbr Len/2nd Weight Sex Delivery Anes PTL Lv  ?3 IAB 02/2021          ?2 Term 11/11/20 [redacted]w[redacted]d  7 lb 1.9 oz (3.23 kg) F CS-LTranv EPI  LIV  ?1 Term 08/02/06 [redacted]w[redacted]d  9 lb 5 oz (4.224 kg) M CS-LTranv EPI  LIV  ? ? ?Past Gynecological History: As per HPI. ?Periods: qmonth, regular, approximately one week, not heavy or painful ?History of Pap Smear(s): Yes.   Last pap 2019, which was negative ?She is currently using no method for contraception.  ? ?Social History:  ?Social History  ? ?Socioeconomic History  ? Marital status: Single  ?  Spouse name: Not on file  ? Number of children:  Not on file  ? Years of education: Not on file  ? Highest education level: Not on file  ?Occupational History  ? Not on file  ?Tobacco Use  ? Smoking status: Never  ? Smokeless tobacco: Never  ?Vaping Use  ? Vaping Use: Never used  ?Substance and Sexual Activity  ? Alcohol use: Not Currently  ?  Comment: once or twice a week  ? Drug use: No  ? Sexual activity: Yes  ?  Partners: Male  ?  Birth control/protection: None  ?Other Topics Concern  ? Not on file  ?Social History Narrative  ? Not on file  ? ?Social Determinants of Health  ? ?Financial Resource Strain: Not on file  ?Food Insecurity: Not on file  ?Transportation Needs: Not on file  ?Physical Activity: Not on file  ?Stress: Not on file  ?Social Connections: Not on file  ?Intimate Partner Violence: Not on file  ? ? ?Family History:  ?Family History  ?Problem Relation Age of Onset  ? Heart disease Father   ? Alcohol abuse Father   ? Lung cancer Maternal Grandmother   ? Diabetes Paternal Grandmother   ? ? ?Medications ?[name] had no medications administered during this visit. ?Current Outpatient Medications  ?Medication Sig Dispense Refill  ? albuterol (PROVENTIL HFA;VENTOLIN HFA) 108 (  90 BASE) MCG/ACT inhaler Inhale 2 puffs into the lungs every 6 (six) hours as needed for wheezing.    ? ?No current facility-administered medications for this visit.  ? ? ?Allergies ?Patient has no known allergies. ? ? ?Physical Exam:  ?BP 137/82   Pulse 73   Ht 5\' 7"  (1.702 m)   Wt 277 lb (125.6 kg)   BMI 43.38 kg/m?  Body mass index is 43.38 kg/m?. ?General appearance: Well nourished, well developed female in no acute distress.  ?Neck:  Supple, normal appearance, and no thyromegaly  ?Cardiovascular: normal s1 and s2.  No murmurs, rubs or gallops. ?Respiratory:  Clear to auscultation bilateral. Normal respiratory effort ?Abdomen: positive bowel sounds and no masses, hernias; diffusely non tender to palpation, non distended ?Breasts: breasts appear normal, no  suspicious masses, no skin or nipple changes or axillary nodes, and normal palpation. ?Neuro/Psych:  Normal mood and affect.  ?Skin:  Warm and dry.  ?Lymphatic:  No inguinal lymphadenopathy.  ? ?Pelvic exam: is limited by body habitus ?EGBUS: within normal limits ?Vagina: within normal limits and with no blood or discharge in the vault ?Cervix: normal appearing cervix without tenderness, discharge or lesions. ?Uterus:  nonenlarged and non tender ?Adnexa:  normal adnexa and no mass, fullness, tenderness ?Rectovaginal: deferred ? ?Laboratory: none ? ?Radiology: none ? ?Assessment: pt stable ? ?Plan:  ?1. Pelvic adhesive disease ?See below ? ?2. BMI 40.0-44.9, adult (HCC) ?Patient has PCP follow up in June ? ?3. Well woman exam with routine gynecological exam ?Patient declines STI testing. Contraception options d/w her and she would like to try OCPs again. Will try Slynd. If not covered, patient leaning towards Nexplanon. I told her about her c-section op note and the adhesions present. She isn't strongly thinking about BTL and I told her it would be a difficult surgery and recommend continuing to use medications for contraception.  ?- Cytology - PAP ? ? ?RTC PRN ? ?July MD ?Attending ?[facility] [employer] [facility])  ?

## 2021-07-19 NOTE — Progress Notes (Signed)
Would like birth control, last unprotected sex since Oct/Nov ?

## 2021-07-24 LAB — CYTOLOGY - PAP
Chlamydia: NEGATIVE
Comment: NEGATIVE
Comment: NEGATIVE
Comment: NEGATIVE
Comment: NORMAL
Diagnosis: NEGATIVE
High risk HPV: NEGATIVE
Neisseria Gonorrhea: NEGATIVE
Trichomonas: NEGATIVE

## 2021-09-12 ENCOUNTER — Ambulatory Visit (INDEPENDENT_AMBULATORY_CARE_PROVIDER_SITE_OTHER): Payer: 59 | Admitting: Obstetrics and Gynecology

## 2021-09-12 VITALS — BP 121/78 | HR 71

## 2021-09-12 DIAGNOSIS — Z30011 Encounter for initial prescription of contraceptive pills: Secondary | ICD-10-CM | POA: Diagnosis not present

## 2021-09-12 NOTE — Progress Notes (Signed)
Obstetrics and Gynecology Visit Return Patient Evaluation  Appointment Date: 09/12/2021  Primary Care Provider: [facility]  OBGYN Clinic: [facility]  Chief Complaint: contraception management  History of Present Illness:  Alexandria Rogers is a 36 y.o. here for nexplanon but would like to do OCPs. Pt never heard about the slynd that was sent in back in April. 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 Current Outpatient Medications  Medication Sig Dispense Refill   albuterol (PROVENTIL HFA;VENTOLIN HFA) 108 (90 BASE) MCG/ACT inhaler Inhale 2 puffs into the lungs every 6 (six) hours as needed for wheezing.     Drospirenone (SLYND) 4 MG TABS Take 1 tablet by mouth daily. (Patient not taking: Reported on 09/12/2021) 90 tablet 3   meloxicam (MOBIC) 15 MG tablet Take 1 tablet (15 mg total) by mouth daily. 30 tablet 2   No current facility-administered medications for this visit.    Allergies: has No Known Allergies.  Physical Exam:  BP 121/78   Pulse 71  There is no height or weight on file to calculate BMI. General appearance: Well nourished, well developed female in no acute distress.  Neuro/Psych:  Normal mood and affect.    Assessment: pt stable  Plan:  1. Encounter for initial prescription of contraceptive pills Pharmacy called and will see if covered. If not, d/w her re: combined OCPs and risk of VTE, HTN; pt is a non smoker. She is okay with combined OCPs if slynd not covered. Can try lo loestrin   RTC: PRN  Cornelia Copa MD Attending [facility])

## 2021-09-14 ENCOUNTER — Encounter: Payer: Self-pay | Admitting: *Deleted

## 2021-09-14 ENCOUNTER — Encounter: Payer: Self-pay | Admitting: Obstetrics and Gynecology

## 2021-09-14 ENCOUNTER — Other Ambulatory Visit: Payer: Self-pay | Admitting: Obstetrics and Gynecology

## 2021-09-14 MED ORDER — LO LOESTRIN FE 1 MG-10 MCG / 10 MCG PO TABS: 1.0000 | ORAL_TABLET | Freq: Every day | ORAL | 3 refills | Status: DC

## 2021-12-28 ENCOUNTER — Encounter: Payer: Self-pay | Admitting: Emergency Medicine

## 2021-12-28 ENCOUNTER — Other Ambulatory Visit: Payer: Self-pay

## 2021-12-28 ENCOUNTER — Emergency Department: Payer: 59

## 2021-12-28 ENCOUNTER — Observation Stay
Admission: EM | Admit: 2021-12-28 | Discharge: 2021-12-29 | Disposition: A | Discharge status: Home / Self Care | Payer: 59 | Attending: Obstetrics | Admitting: Obstetrics

## 2021-12-28 DIAGNOSIS — R55 Syncope and collapse: Secondary | ICD-10-CM | POA: Diagnosis not present

## 2021-12-28 DIAGNOSIS — J45909 Unspecified asthma, uncomplicated: Secondary | ICD-10-CM | POA: Diagnosis not present

## 2021-12-28 DIAGNOSIS — N83201 Unspecified ovarian cyst, right side: Principal | ICD-10-CM | POA: Diagnosis present

## 2021-12-28 DIAGNOSIS — N83209 Unspecified ovarian cyst, unspecified side: Secondary | ICD-10-CM

## 2021-12-28 DIAGNOSIS — R1031 Right lower quadrant pain: Secondary | ICD-10-CM | POA: Diagnosis present

## 2021-12-28 HISTORY — DX: Unspecified ovarian cyst, right side: N83.201

## 2021-12-28 LAB — URINALYSIS, ROUTINE W REFLEX MICROSCOPIC
Bilirubin Urine: NEGATIVE
Glucose, UA: NEGATIVE mg/dL
Hgb urine dipstick: NEGATIVE
Ketones, ur: 15 mg/dL — AB
Leukocytes,Ua: NEGATIVE
Nitrite: NEGATIVE
Protein, ur: NEGATIVE mg/dL
Specific Gravity, Urine: 1.025 (ref 1.005–1.030)
pH: 6 (ref 5.0–8.0)

## 2021-12-28 LAB — CBC
HCT: 30.6 % — ABNORMAL LOW (ref 36.0–46.0)
Hemoglobin: 10.2 g/dL — ABNORMAL LOW (ref 12.0–15.0)
MCH: 31 pg (ref 26.0–34.0)
MCHC: 33.3 g/dL (ref 30.0–36.0)
MCV: 93 fL (ref 80.0–100.0)
Platelets: 217 10*3/uL (ref 150–400)
RBC: 3.29 MIL/uL — ABNORMAL LOW (ref 3.87–5.11)
RDW: 12.4 % (ref 11.5–15.5)
WBC: 9.7 10*3/uL (ref 4.0–10.5)
nRBC: 0 % (ref 0.0–0.2)

## 2021-12-28 LAB — CBC WITH DIFFERENTIAL/PLATELET
Abs Immature Granulocytes: 0.07 10*3/uL (ref 0.00–0.07)
Basophils Absolute: 0 10*3/uL (ref 0.0–0.1)
Basophils Relative: 0 %
Eosinophils Absolute: 0 10*3/uL (ref 0.0–0.5)
Eosinophils Relative: 0 %
HCT: 38.2 % (ref 36.0–46.0)
Hemoglobin: 12.8 g/dL (ref 12.0–15.0)
Immature Granulocytes: 0 %
Lymphocytes Relative: 9 %
Lymphs Abs: 1.4 10*3/uL (ref 0.7–4.0)
MCH: 30.9 pg (ref 26.0–34.0)
MCHC: 33.5 g/dL (ref 30.0–36.0)
MCV: 92.3 fL (ref 80.0–100.0)
Monocytes Absolute: 0.7 10*3/uL (ref 0.1–1.0)
Monocytes Relative: 4 %
Neutro Abs: 13.4 10*3/uL — ABNORMAL HIGH (ref 1.7–7.7)
Neutrophils Relative %: 87 %
Platelets: 288 10*3/uL (ref 150–400)
RBC: 4.14 MIL/uL (ref 3.87–5.11)
RDW: 12.2 % (ref 11.5–15.5)
WBC: 15.6 10*3/uL — ABNORMAL HIGH (ref 4.0–10.5)
nRBC: 0 % (ref 0.0–0.2)

## 2021-12-28 LAB — COMPREHENSIVE METABOLIC PANEL
ALT: 16 U/L (ref 0–44)
AST: 22 U/L (ref 15–41)
Albumin: 4.2 g/dL (ref 3.5–5.0)
Alkaline Phosphatase: 52 U/L (ref 38–126)
Anion gap: 9 (ref 5–15)
BUN: 17 mg/dL (ref 6–20)
CO2: 21 mmol/L — ABNORMAL LOW (ref 22–32)
Calcium: 9.6 mg/dL (ref 8.9–10.3)
Chloride: 105 mmol/L (ref 98–111)
Creatinine, Ser: 0.84 mg/dL (ref 0.44–1.00)
GFR, Estimated: >60 mL/min (ref >60)
Glucose, Bld: 154 mg/dL — ABNORMAL HIGH (ref 70–99)
Potassium: 4.1 mmol/L (ref 3.5–5.1)
Sodium: 135 mmol/L (ref 135–145)
Total Bilirubin: 0.8 mg/dL (ref 0.3–1.2)
Total Protein: 8 g/dL (ref 6.5–8.1)

## 2021-12-28 LAB — TROPONIN I (HIGH SENSITIVITY)
Troponin I (High Sensitivity): <2 ng/L (ref <18)
Troponin I (High Sensitivity): <2 ng/L (ref <18)

## 2021-12-28 LAB — PROTIME-INR
INR: 1.1 (ref 0.8–1.2)
Prothrombin Time: 13.6 seconds (ref 11.4–15.2)

## 2021-12-28 LAB — POC URINE PREG, ED: Preg Test, Ur: NEGATIVE

## 2021-12-28 LAB — HEMOGLOBIN AND HEMATOCRIT, BLOOD
HCT: 33.4 % — ABNORMAL LOW (ref 36.0–46.0)
HCT: 31.4 % — ABNORMAL LOW (ref 36.0–46.0)
Hemoglobin: 11.4 g/dL — ABNORMAL LOW (ref 12.0–15.0)
Hemoglobin: 10.7 g/dL — ABNORMAL LOW (ref 12.0–15.0)

## 2021-12-28 LAB — LIPASE, BLOOD: Lipase: 27 U/L (ref 11–51)

## 2021-12-28 LAB — D-DIMER, QUANTITATIVE: D-Dimer, Quant: 0.59 ug/mL-FEU — ABNORMAL HIGH (ref 0.00–0.50)

## 2021-12-28 LAB — MAGNESIUM: Magnesium: 1.8 mg/dL (ref 1.7–2.4)

## 2021-12-28 MED ORDER — INFLUENZA VAC SPLIT QUAD 0.5 ML IM SUSY
0.5000 mL | PREFILLED_SYRINGE | INTRAMUSCULAR | Status: DC
  Filled 2021-12-28: qty 0.5

## 2021-12-28 MED ORDER — KETOROLAC TROMETHAMINE 30 MG/ML IJ SOLN
30.0000 mg | Freq: Three times a day (TID) | INTRAMUSCULAR | Status: DC | PRN
  Administered 2021-12-28 – 2021-12-29 (×2): 30 mg via INTRAVENOUS
  Filled 2021-12-28 (×2): qty 1

## 2021-12-28 MED ORDER — HYDROMORPHONE HCL 1 MG/ML IJ SOLN
0.5000 mg | Freq: Once | INTRAMUSCULAR | Status: AC
  Administered 2021-12-28: 0.5 mg via INTRAVENOUS
  Filled 2021-12-28: qty 0.5

## 2021-12-28 MED ORDER — HYDROCODONE-ACETAMINOPHEN 5-325 MG PO TABS
1.0000 | ORAL_TABLET | ORAL | Status: DC | PRN
  Administered 2021-12-29: 1 via ORAL
  Filled 2021-12-28: qty 1

## 2021-12-28 MED ORDER — IOHEXOL 350 MG/ML SOLN
100.0000 mL | Freq: Once | INTRAVENOUS | Status: AC | PRN
  Administered 2021-12-28: 100 mL via INTRAVENOUS

## 2021-12-28 MED ORDER — MORPHINE SULFATE (PF) 2 MG/ML IV SOLN
1.0000 mg | INTRAVENOUS | Status: DC | PRN
  Administered 2021-12-28 (×5): 1 mg via INTRAVENOUS
  Filled 2021-12-28 (×5): qty 1

## 2021-12-28 MED ORDER — GABAPENTIN 300 MG PO CAPS: 300.0000 mg | ORAL_CAPSULE | Freq: Every day | ORAL | Status: DC

## 2021-12-28 MED ORDER — LACTATED RINGERS IV BOLUS
1000.0000 mL | Freq: Once | INTRAVENOUS | Status: AC
  Administered 2021-12-28: 1000 mL via INTRAVENOUS

## 2021-12-28 MED ORDER — HYDROMORPHONE HCL 1 MG/ML IJ SOLN
1.0000 mg | Freq: Once | INTRAMUSCULAR | Status: AC
  Administered 2021-12-28: 1 mg via INTRAVENOUS

## 2021-12-28 MED ORDER — HYDROMORPHONE HCL 1 MG/ML IJ SOLN
INTRAMUSCULAR | Status: AC
  Filled 2021-12-28: qty 1

## 2021-12-28 MED ORDER — GABAPENTIN 600 MG PO TABS
300.0000 mg | ORAL_TABLET | Freq: Every day | ORAL | Status: DC
  Administered 2021-12-28: 300 mg via ORAL
  Filled 2021-12-28 (×2): qty 0.5

## 2021-12-28 MED ORDER — LACTATED RINGERS IV SOLN: INTRAVENOUS | Status: DC

## 2021-12-28 NOTE — Progress Notes (Signed)
Patient ID: Alexandria Rogers, female   DOB: Mar 25, 1986, 36 y.o.   MRN: [id number] Pain has improved throughout  day , but still present .  Less pain meds required . HCT with small drop   I will decrease IVF to 75 cc/ hr and allow soft diet tonight again with NPO status  2400 incase significant drop in HCT and surgery required .   PO main meds + toradol IV . Stop Morphine  Repeat labs in am

## 2021-12-28 NOTE — Progress Notes (Signed)
Subjective: Patient reports pt admitted today with right sided and lower pelvic pain and sob . Syncopal episode at home . ED workup c/w hemorrhagic CL cyst .  Pt is not on contraception .  No prior h/o ovarian cyst . H+H stable currently  Neg CTA for PE  Objective: I have reviewed patient's vital signs, intake and output, labs, and radiology results.  Abd : non distended . Mild TTP throughout . No rebound   Assessment/Plan: Hemorrhagia CL cyst , currently stable  Cont NPO and IV pain meds prn  Repeat CBC at 1600. If pain persists worsens or Hct drops significantly she may be candidate for L/S evaluation . All questions answered   LOS: 0 days    Boykin Nearing, MD 12/28/2021, 12:26 PM

## 2021-12-28 NOTE — ED Notes (Signed)
Pt back from US

## 2021-12-28 NOTE — ED Triage Notes (Addendum)
Pt presents via POV with complaints of lower abdominal pain with radiation towards her butt that started tonight. Per Friend, the patient has had 2 syncopal episodes tonight - one while walking into the ED. Pt is diaphoretic and endorses dizziness with ambulation. Denies CP or SOB.

## 2021-12-28 NOTE — ED Provider Notes (Signed)
[facility] Provider Note    Event Date/Time   First MD Initiated Contact with Patient 12/28/21 0109     (approximate)   History   Abdominal Pain   HPI  Alexandria Rogers is a 36 y.o. female who presents to the ED for evaluation of Abdominal Pain   I reviewed annual PCP visit from June.  Morbidly obese patient with history of allergies and atopy.  Stress and anxiety associated with a custody battle and separation.  Patient presents to the ED with a friend for evaluation of right-sided discomfort and an episode of syncope.  She reports intermittent discomfort in this area throughout the day today, but became quite severe just an hour prior to arrival.  Reports pain beneath her right breast primarily sometimes to the RLQ as well.  Nausea without emesis.  No stool changes.  She reports an episode of syncope while walking into the ED today.  This has never happened before.  Has had 2 cesarean sections, but no other intra-abdominal surgeries.  No recent fevers.  Reports difficulty breathing alongside the pain.  Physical Exam   Triage Vital Signs: ED Triage Vitals  Enc Vitals Group     BP 12/28/21 0107 (!) 81/55     Pulse Rate 12/28/21 0107 (!) 103     Resp 12/28/21 0107 20     Temp 12/28/21 0107 98.3 F (36.8 C)     Temp Source 12/28/21 0107 Oral     SpO2 12/28/21 0107 98 %     Weight 12/28/21 0106 279 lb 8.7 oz (126.8 kg)     Height 12/28/21 0106 5\' 7"  (1.702 m)     Head Circumference --      Peak Flow --      Pain Score 12/28/21 0106 6     Pain Loc --      Pain Edu? --      Excl. in GC? --     Most recent vital signs: Vitals:   12/28/21 0506 12/28/21 0600  BP: 116/73 123/72  Pulse: 83 67  Resp: 18 13  Temp: 98.3 F (36.8 C)   SpO2: 98% 98%    General: Awake.  Obese, quite uncomfortable and sitting upright in bed, tearful and clutching her right side CV:  Good peripheral perfusion.  Tachycardic. Resp:  Normal effort.  Tachypneic to the  mid 20s. Abd:  No distention.  Tenderness to the RUQ and RLQ. MSK:  No deformity noted.  Neuro:  No focal deficits appreciated. Other:     ED Results / Procedures / Treatments   Labs (all labs ordered are listed, but only abnormal results are displayed) Labs Reviewed  URINALYSIS, ROUTINE W REFLEX MICROSCOPIC - Abnormal; Notable for the following components:      Result Value   Ketones, ur 15 (*)    All other components within normal limits  CBC WITH DIFFERENTIAL/PLATELET - Abnormal; Notable for the following components:   WBC 15.6 (*)    Neutro Abs 13.4 (*)    All other components within normal limits  COMPREHENSIVE METABOLIC PANEL - Abnormal; Notable for the following components:   CO2 21 (*)    Glucose, Bld 154 (*)    All other components within normal limits  D-DIMER, QUANTITATIVE - Abnormal; Notable for the following components:   D-Dimer, Quant 0.59 (*)    All other components within normal limits  HEMOGLOBIN AND HEMATOCRIT, BLOOD - Abnormal; Notable for the following components:   Hemoglobin 11.4 (*)  HCT 33.4 (*)    All other components within normal limits  MAGNESIUM  LIPASE, BLOOD  PROTIME-INR  POC URINE PREG, ED  TROPONIN I (HIGH SENSITIVITY)  TROPONIN I (HIGH SENSITIVITY)    EKG Sinus tachycardia with a rate of 102 bpm.  Normal axis and intervals.  No clear signs of acute ischemia.  RADIOLOGY CT chest interpreted by me without evidence of saddle PE. CT abdomen/pelvis interpreted by me with right pelvic pathology noted  Official radiology report(s): US PELVIC COMPLETE W TRANSVAGINAL AND TORSION R/O  Result Date: 12/28/2021 CLINICAL DATA:  Initial evaluation for acute right lower quadrant pain. EXAM: TRANSABDOMINAL AND TRANSVAGINAL ULTRASOUND OF PELVIS DOPPLER ULTRASOUND OF OVARIES TECHNIQUE: Both transabdominal and transvaginal ultrasound examinations of the pelvis were performed. Transabdominal technique was performed for global imaging of the pelvis  including uterus, ovaries, adnexal regions, and pelvic cul-de-sac. It was necessary to proceed with endovaginal exam following the transabdominal exam to visualize the endometrium. Color and duplex Doppler ultrasound was utilized to evaluate blood flow to the ovaries. COMPARISON:  CT from earlier the same day. FINDINGS: Uterus Measurements: 11.7 x 5.0 x 7.3 cm = volume: 226.5 mL. No fibroids or other mass visualized. Endometrium Thickness: 6.6 mm.  No focal abnormality visualized. Right ovary Measurements: 5.8 x 4.1 x 4.8 cm = volume: 60.3 mL. 2.5 x 2.5 x 2.3 cm complex cyst seen within the right ovary. Eccentric internal echogenicity favored to reflect blood products/retractile clot. No internal vascularity or solid nodularity. Left ovary Measurements: 3.2 x 1.5 x 3.0 cm = volume: 7.8 mL. Normal appearance/no adnexal mass. Pulsed Doppler evaluation of both ovaries demonstrates normal low-resistance arterial and venous waveforms. Other findings Moderate volume complex fluid seen surrounding the right ovary, likely reflecting hemorrhage/blood products. IMPRESSION: 1. 2.5 cm complex right ovarian cyst, likely a hemorrhagic cyst with internal retractile clot. Associated moderate volume complex free fluid surrounding the right ovary, likely reflecting hemorrhage/blood products, and suggestive of recent cyst rupture. 2. No evidence for ovarian torsion. Electronically Signed   By: Rise Mu M.D.   On: 12/28/2021 05:01   CT ABDOMEN PELVIS W CONTRAST  Result Date: 12/28/2021 CLINICAL DATA:  Syncopal episode with lower abdominal pain. EXAM: CT ABDOMEN AND PELVIS WITH CONTRAST TECHNIQUE: Multidetector CT imaging of the abdomen and pelvis was performed using the standard protocol following bolus administration of intravenous contrast. RADIATION DOSE REDUCTION: This exam was performed according to the departmental dose-optimization program which includes automated exposure control, adjustment of the mA and/or kV  according to patient size and/or use of iterative reconstruction technique. CONTRAST:  OMNIPAQUE IOHEXOL 350 MG/ML SOLN COMPARISON:  October 30, 2009 FINDINGS: Lower chest: No acute abnormality. Hepatobiliary: No focal liver abnormality is seen. No gallstones, gallbladder wall thickening, or biliary dilatation. Pancreas: Unremarkable. No pancreatic ductal dilatation or surrounding inflammatory changes. Spleen: Normal in size without focal abnormality. Adrenals/Urinary Tract: Adrenal glands are unremarkable. Kidneys are normal, without renal calculi, focal lesion, or hydronephrosis. Bladder is unremarkable. Stomach/Bowel: Stomach is within normal limits. Appendix appears normal. No evidence of bowel wall thickening, distention, or inflammatory changes. Vascular/Lymphatic: No significant vascular findings are present. No enlarged abdominal or pelvic lymph nodes. Reproductive: The uterus and left adnexa are unremarkable. The IUD seen within the uterus on the prior study is no longer present. A 2.5 cm by 2.5 cm right ovarian cyst is seen, with a tubular appearing fluid filled structure noted along the anterior aspect of the right adnexa. A mild to moderate amount of surrounding hyperdense  peri adnexal blood is present (approximately 70.25 Hounsfield units). Other: There is a small, stable fat containing umbilical hernia. A small amount of hemorrhagic fluid is noted within the posterior aspect of the pelvis. A mild-to-moderate amount of nonhemorrhagic perihepatic, perisplenic and right paracolic gutter fluid is noted. Musculoskeletal: IMPRESSION: 1. Findings which may represent a ruptured right ovarian cyst, with a mild-to-moderate amount of associated acute right peri adnexal blood. Further evaluation is recommended to exclude sequelae associated with a ruptured ectopic pregnancy 2. Right ovarian cyst and suspected dilated fallopian tubes. Further evaluation with pelvic ultrasound is recommended. 3. Mild to moderate  amount of nonhemorrhagic abdominal free fluid. Electronically Signed   By: Aram Candelahaddeus  Houston M.D.   On: 12/28/2021 03:35   CT Angio Chest PE W and/or Wo Contrast  Result Date: 12/28/2021 CLINICAL DATA:  Syncopal episode with lower abdominal pain. EXAM: CT ANGIOGRAPHY CHEST WITH CONTRAST TECHNIQUE: Multidetector CT imaging of the chest was performed using the standard protocol during bolus administration of intravenous contrast. Multiplanar CT image reconstructions and MIPs were obtained to evaluate the vascular anatomy. RADIATION DOSE REDUCTION: This exam was performed according to the departmental dose-optimization program which includes automated exposure control, adjustment of the mA and/or kV according to patient size and/or use of iterative reconstruction technique. CONTRAST:  100mL OMNIPAQUE IOHEXOL 350 MG/ML SOLN COMPARISON:  None Available. FINDINGS: Cardiovascular: A thoracic aorta is normal appearance. Satisfactory opacification of the pulmonary arteries to the segmental level. No evidence of pulmonary embolism. Normal heart size. No pericardial effusion. Mediastinum/Nodes: No enlarged mediastinal, hilar, or axillary lymph nodes. Thyroid gland, trachea, and esophagus demonstrate no significant findings. Lungs/Pleura: Lungs are clear. No pleural effusion or pneumothorax. Upper Abdomen: There is a mild-to-moderate amount of perihepatic and perisplenic fluid. Musculoskeletal: No chest wall abnormality. No acute or significant osseous findings. Review of the MIP images confirms the above findings. IMPRESSION: 1. No evidence of pulmonary embolism or other acute intrathoracic pathology. 2. Mild-to-moderate amount of perihepatic and perisplenic fluid. Electronically Signed   By: Aram Candelahaddeus  Houston M.D.   On: 12/28/2021 03:24    PROCEDURES and INTERVENTIONS:  .1-3 Lead EKG Interpretation  Performed by: Delton PrairieSmith, Joby Hershkowitz, MD Authorized by: Delton PrairieSmith, Shayma Pfefferle, MD     Interpretation: abnormal     ECG rate:  102    ECG rate assessment: tachycardic     Rhythm: sinus tachycardia     Ectopy: none     Conduction: normal     Medications  lactated ringers bolus 1,000 mL (0 mLs Intravenous Stopped 12/28/21 0220)  HYDROmorphone (DILAUDID) injection 1 mg (1 mg Intravenous Given 12/28/21 0115)  iohexol (OMNIPAQUE) 350 MG/ML injection 100 mL (100 mLs Intravenous Contrast Given 12/28/21 0240)  HYDROmorphone (DILAUDID) injection 0.5 mg (0.5 mg Intravenous Given 12/28/21 0443)  HYDROmorphone (DILAUDID) injection 0.5 mg (0.5 mg Intravenous Given 12/28/21 0533)     IMPRESSION / MDM / ASSESSMENT AND PLAN / ED COURSE  I reviewed the triage vital signs and the nursing notes.  Differential diagnosis includes, but is not limited to, PE, cardiac dysrhythmia, ovarian torsion, ruptured cyst, appendicitis, sepsis  {Patient presents with symptoms of an acute illness or injury that is potentially life-threatening.  36 year old woman presents to the ED with right-sided pain and syncope, likely due to a ruptured hemorrhagic cyst.  She appears quite uncomfortable on presentation and is noted to be tachycardic with soft blood pressure.  Hemodynamics improved with pain control and IV fluids, no recurrence of low blood pressure or shock.  Doubt sepsis, though her  leukocytosis is noted.  Metabolic panel is essentially normal.  D-dimer is positive and CTA chest obtained without evidence of PE or acute cardiopulmonary pathology.  Troponin is negative no dysrhythmias on the monitor.  CT abdomen/pelvis without evidence of hepatobiliary pathology beyond some perihepatic fluid, and no signs of appendicitis, but does show pelvic and ovarian pathology.  She gets a follow-up ultrasound of the pelvis that does demonstrate signs of a hemorrhagic cyst that is ruptured.  She has continued pain requiring multiple recurrent doses of Dilaudid and tender to palpation to her abdomen.  While her hemodynamics are reassuring and I do not believe she emergently  needs to go to the operating room, I consult with OB/GYN to evaluate the patient for admission for pain control and reassessments.  Clinical Course as of 12/28/21 0612  Thu Dec 28, 2021  0350 Reassessed and discussed CT results, my recommendation for pelvic ultrasound.  She is agreeable. [DS]  [phone] Consult with obgyn midwife on call [DS]  [phone] I discuss with Avelino Leeds CNM, agrees to see the patient for possible admission and obs for pain control [DS]    Clinical Course User Index [DS] Vladimir Crofts, MD     FINAL CLINICAL IMPRESSION(S) / ED DIAGNOSES   Final diagnoses:  Hemorrhagic cyst of right ovary  Ruptured cyst of ovary  Syncope and collapse     Rx / DC Orders   ED Discharge Orders     None        Note:  This document was prepared using Dragon voice recognition software and may include unintentional dictation errors.   Vladimir Crofts, MD 12/28/21 [phone]

## 2021-12-28 NOTE — ED Notes (Signed)
Patient transported to CT 

## 2021-12-28 NOTE — ED Notes (Signed)
Patient ambulatory to toilet with standby assist.   

## 2021-12-28 NOTE — ED Notes (Signed)
Pt in US

## 2021-12-28 NOTE — Consult Note (Addendum)
Consult History and Physical   SERVICE: Gynecology   Patient Name: Alexandria Rogers Patient MRN:   [id number]  CC: abdominal pain with an episodes of syncope  HPI: Alexandria Rogers is a 36 y.o. [id number] with intermittent right sided abdominal pain that become increasingly worse a 5pm with the first episode of syncope at midnight, followed by a second episode of syncope while in the ED.she endorses difficulty taking deep breaths d/t pain. Denies any vaginal bleeding at this time.   Review of Systems: positives in bold GEN:   fevers, chills, weight changes, appetite changes, fatigue, night sweats HEENT:  HA, vision changes, hearing loss, congestion, rhinorrhea, sinus pressure, dysphagia CV:   CP, palpitations PULM:  SOB, cough GI:  abd pain, N/V/D/C GU:  dysuria, urgency, frequency MSK:  arthralgias, myalgias, back pain, swelling SKIN:  rashes, color changes, pallor NEURO:  numbness, weakness, tingling, seizures, dizziness, tremors PSYCH:  depression, anxiety, behavioral problems, confusion  HEME/LYMPH:  easy bruising or bleeding ENDO:  heat/cold intolerance  Past Obstetrical History: OB History     Gravida  3   Para  2   Term  2   Preterm      AB  1   Living  2      SAB      IAB  1   Ectopic      Multiple  0   Live Births  2           Past Gynecologic History: LMP 12/09/21 Menstrual frequency Q 4 wks lasting 4 days. Last 2 menstrual cycles were longer in length and lasted 7-9 days.  Past Medical History: Past Medical History:  Diagnosis Date   Asthma    Exercise-induced asthma 12/24/2012    Past Surgical History:   Past Surgical History:  Procedure Laterality Date   CESAREAN SECTION     CESAREAN SECTION  11/11/2020   Procedure: CESAREAN SECTION;  Surgeon: Warden Fillers, MD;  Location: [facility];  Service: Obstetrics;;   WISDOM TOOTH EXTRACTION      Family History:  family history includes Alcohol abuse in her father; Diabetes in her  paternal grandmother; Heart disease in her father; Lung cancer in her maternal grandmother.  Social History:  Social History   Socioeconomic History   Marital status: Single    Spouse name: Not on file   Number of children: Not on file   Years of education: Not on file   Highest education level: Not on file  Occupational History   Not on file  Tobacco Use   Smoking status: Never   Smokeless tobacco: Never  Vaping Use   Vaping Use: Never used  Substance and Sexual Activity   Alcohol use: Not Currently    Comment: once or twice a week   Drug use: No   Sexual activity: Yes    Partners: Male    Birth control/protection: None  Other Topics Concern   Not on file  Social History Narrative   Not on file   Social Determinants of Health   Financial Resource Strain: Not on file  Food Insecurity: Not on file  Transportation Needs: Not on file  Physical Activity: Not on file  Stress: Not on file  Social Connections: Not on file  Intimate Partner Violence: Not on file    Home Medications:  Medications reconciled in EPIC  No current facility-administered medications on file prior to encounter.   Current Outpatient Medications on File Prior to Encounter  Medication  Sig Dispense Refill   albuterol (PROVENTIL HFA;VENTOLIN HFA) 108 (90 BASE) MCG/ACT inhaler Inhale 2 puffs into the lungs every 6 (six) hours as needed for wheezing.     LO LOESTRIN FE 1 MG-10 MCG / 10 MCG tablet Take 1 tablet by mouth daily. 90 tablet 3   meloxicam (MOBIC) 15 MG tablet Take 1 tablet (15 mg total) by mouth daily. 30 tablet 2    Allergies:  No Known Allergies  Physical Exam:  Temp:  [98.3 F (36.8 C)] 98.3 F (36.8 C) (09/21 0506) Pulse Rate:  [59-103] 67 (09/21 0600) Resp:  [11-22] 13 (09/21 0600) BP: (81-123)/(55-88) 123/72 (09/21 0600) SpO2:  [97 %-98 %] 98 % (09/21 0600) Weight:  [126.8 kg] 126.8 kg (09/21 0106)   Physical Exam Constitutional:      Appearance: Normal appearance. She is  obese.  HENT:     Head: Normocephalic.  Cardiovascular:     Rate and Rhythm: Normal rate.     Pulses: Normal pulses.  Pulmonary:     Effort: Pulmonary effort is normal.     Breath sounds: Normal breath sounds.  Abdominal:     Tenderness: There is abdominal tenderness. There is guarding.  Musculoskeletal:        General: Normal range of motion.     Cervical back: Normal range of motion.  Neurological:     Mental Status: She is alert and oriented to person, place, and time.  Skin:    General: Skin is warm and dry.  Psychiatric:        Mood and Affect: Mood normal.        Behavior: Behavior normal.        Thought Content: Thought content normal.        Judgment: Judgment normal.       Labs/Studies:   CBC and Coags:  Lab Results  Component Value Date   WBC 15.6 (H) 12/28/2021   NEUTOPHILPCT 87 12/28/2021   EOSPCT 0 12/28/2021   BASOPCT 0 12/28/2021   LYMPHOPCT 9 12/28/2021   HGB 11.4 (L) 12/28/2021   HCT 33.4 (L) 12/28/2021   MCV 92.3 12/28/2021   PLT 288 12/28/2021   INR 1.1 12/28/2021   CMP:  Lab Results  Component Value Date   NA 135 12/28/2021   K 4.1 12/28/2021   CL 105 12/28/2021   CO2 21 (L) 12/28/2021   BUN 17 12/28/2021   CREATININE 0.84 12/28/2021   CREATININE 0.70 11/17/2020   CREATININE 0.82 11/11/2020   PROT 8.0 12/28/2021   BILITOT 0.8 12/28/2021   ALT 16 12/28/2021   AST 22 12/28/2021   ALKPHOS 52 12/28/2021   TVUS:   Other Imaging: US PELVIC COMPLETE W TRANSVAGINAL AND TORSION R/O  Result Date: 12/28/2021 CLINICAL DATA:  Initial evaluation for acute right lower quadrant pain. EXAM: TRANSABDOMINAL AND TRANSVAGINAL ULTRASOUND OF PELVIS DOPPLER ULTRASOUND OF OVARIES TECHNIQUE: Both transabdominal and transvaginal ultrasound examinations of the pelvis were performed. Transabdominal technique was performed for global imaging of the pelvis including uterus, ovaries, adnexal regions, and pelvic cul-de-sac. It was necessary to proceed with  endovaginal exam following the transabdominal exam to visualize the endometrium. Color and duplex Doppler ultrasound was utilized to evaluate blood flow to the ovaries. COMPARISON:  CT from earlier the same day. FINDINGS: Uterus Measurements: 11.7 x 5.0 x 7.3 cm = volume: 226.5 mL. No fibroids or other mass visualized. Endometrium Thickness: 6.6 mm.  No focal abnormality visualized. Right ovary Measurements: 5.8 x 4.1 x 4.8 cm =  volume: 60.3 mL. 2.5 x 2.5 x 2.3 cm complex cyst seen within the right ovary. Eccentric internal echogenicity favored to reflect blood products/retractile clot. No internal vascularity or solid nodularity. Left ovary Measurements: 3.2 x 1.5 x 3.0 cm = volume: 7.8 mL. Normal appearance/no adnexal mass. Pulsed Doppler evaluation of both ovaries demonstrates normal low-resistance arterial and venous waveforms. Other findings Moderate volume complex fluid seen surrounding the right ovary, likely reflecting hemorrhage/blood products. IMPRESSION: 1. 2.5 cm complex right ovarian cyst, likely a hemorrhagic cyst with internal retractile clot. Associated moderate volume complex free fluid surrounding the right ovary, likely reflecting hemorrhage/blood products, and suggestive of recent cyst rupture. 2. No evidence for ovarian torsion. Electronically Signed   By: Rise MuBenjamin  McClintock M.D.   On: 12/28/2021 05:01   CT ABDOMEN PELVIS W CONTRAST  Result Date: 12/28/2021 CLINICAL DATA:  Syncopal episode with lower abdominal pain. EXAM: CT ABDOMEN AND PELVIS WITH CONTRAST TECHNIQUE: Multidetector CT imaging of the abdomen and pelvis was performed using the standard protocol following bolus administration of intravenous contrast. RADIATION DOSE REDUCTION: This exam was performed according to the departmental dose-optimization program which includes automated exposure control, adjustment of the mA and/or kV according to patient size and/or use of iterative reconstruction technique. CONTRAST:  100mL  OMNIPAQUE IOHEXOL 350 MG/ML SOLN COMPARISON:  October 30, 2009 FINDINGS: Lower chest: No acute abnormality. Hepatobiliary: No focal liver abnormality is seen. No gallstones, gallbladder wall thickening, or biliary dilatation. Pancreas: Unremarkable. No pancreatic ductal dilatation or surrounding inflammatory changes. Spleen: Normal in size without focal abnormality. Adrenals/Urinary Tract: Adrenal glands are unremarkable. Kidneys are normal, without renal calculi, focal lesion, or hydronephrosis. Bladder is unremarkable. Stomach/Bowel: Stomach is within normal limits. Appendix appears normal. No evidence of bowel wall thickening, distention, or inflammatory changes. Vascular/Lymphatic: No significant vascular findings are present. No enlarged abdominal or pelvic lymph nodes. Reproductive: The uterus and left adnexa are unremarkable. The IUD seen within the uterus on the prior study is no longer present. A 2.5 cm by 2.5 cm right ovarian cyst is seen, with a tubular appearing fluid filled structure noted along the anterior aspect of the right adnexa. A mild to moderate amount of surrounding hyperdense peri adnexal blood is present (approximately 70.25 Hounsfield units). Other: There is a small, stable fat containing umbilical hernia. A small amount of hemorrhagic fluid is noted within the posterior aspect of the pelvis. A mild-to-moderate amount of nonhemorrhagic perihepatic, perisplenic and right paracolic gutter fluid is noted. Musculoskeletal: IMPRESSION: 1. Findings which may represent a ruptured right ovarian cyst, with a mild-to-moderate amount of associated acute right peri adnexal blood. Further evaluation is recommended to exclude sequelae associated with a ruptured ectopic pregnancy 2. Right ovarian cyst and suspected dilated fallopian tubes. Further evaluation with pelvic ultrasound is recommended. 3. Mild to moderate amount of nonhemorrhagic abdominal free fluid. Electronically Signed   By: Aram Candelahaddeus  Houston  M.D.   On: 12/28/2021 03:35   CT Angio Chest PE W and/or Wo Contrast  Result Date: 12/28/2021 CLINICAL DATA:  Syncopal episode with lower abdominal pain. EXAM: CT ANGIOGRAPHY CHEST WITH CONTRAST TECHNIQUE: Multidetector CT imaging of the chest was performed using the standard protocol during bolus administration of intravenous contrast. Multiplanar CT image reconstructions and MIPs were obtained to evaluate the vascular anatomy. RADIATION DOSE REDUCTION: This exam was performed according to the departmental dose-optimization program which includes automated exposure control, adjustment of the mA and/or kV according to patient size and/or use of iterative reconstruction technique. CONTRAST:  100mL OMNIPAQUE IOHEXOL  350 MG/ML SOLN COMPARISON:  None Available. FINDINGS: Cardiovascular: A thoracic aorta is normal appearance. Satisfactory opacification of the pulmonary arteries to the segmental level. No evidence of pulmonary embolism. Normal heart size. No pericardial effusion. Mediastinum/Nodes: No enlarged mediastinal, hilar, or axillary lymph nodes. Thyroid gland, trachea, and esophagus demonstrate no significant findings. Lungs/Pleura: Lungs are clear. No pleural effusion or pneumothorax. Upper Abdomen: There is a mild-to-moderate amount of perihepatic and perisplenic fluid. Musculoskeletal: No chest wall abnormality. No acute or significant osseous findings. Review of the MIP images confirms the above findings. IMPRESSION: 1. No evidence of pulmonary embolism or other acute intrathoracic pathology. 2. Mild-to-moderate amount of perihepatic and perisplenic fluid. Electronically Signed   By: Virgina Norfolk M.D.   On: 12/28/2021 03:24     Assessment / Plan:   ALIXANDRIA FRIEDT is a 36 y.o. [id number] who presents with right sided abdominal pain, and 2 episodes of syncope  1. Admit for observation 2. Repeat TVUS 3. Serial H/H 4. Monitor VS 5.Pain control   Thank you for the opportunity to be  involved with this patient's care.   Dr Ouida Sills aware and agrees with POC ----- [place] [facility], Department of [facility]

## 2021-12-28 NOTE — ED Notes (Signed)
Assumed care 0715 pt resting with eyes closed, chest rise and fall noted. Report called to RN Amado Coe on 3rd floor. Pt awaiting transport with no needs.

## 2021-12-28 NOTE — ED Notes (Signed)
EDP, Zuriah Bordas at bedside. 

## 2021-12-28 NOTE — ED Notes (Signed)
Patient made aware of need for urine specimen; states she would like to wait at this time, feels very light headed.

## 2021-12-29 LAB — BASIC METABOLIC PANEL
Anion gap: 4 — ABNORMAL LOW (ref 5–15)
BUN: 12 mg/dL (ref 6–20)
CO2: 26 mmol/L (ref 22–32)
Calcium: 8.7 mg/dL — ABNORMAL LOW (ref 8.9–10.3)
Chloride: 108 mmol/L (ref 98–111)
Creatinine, Ser: 0.61 mg/dL (ref 0.44–1.00)
GFR, Estimated: >60 mL/min (ref >60)
Glucose, Bld: 92 mg/dL (ref 70–99)
Potassium: 3.8 mmol/L (ref 3.5–5.1)
Sodium: 138 mmol/L (ref 135–145)

## 2021-12-29 LAB — CBC
HCT: 29.1 % — ABNORMAL LOW (ref 36.0–46.0)
Hemoglobin: 9.6 g/dL — ABNORMAL LOW (ref 12.0–15.0)
MCH: 30.9 pg (ref 26.0–34.0)
MCHC: 33 g/dL (ref 30.0–36.0)
MCV: 93.6 fL (ref 80.0–100.0)
Platelets: 207 10*3/uL (ref 150–400)
RBC: 3.11 MIL/uL — ABNORMAL LOW (ref 3.87–5.11)
RDW: 12.3 % (ref 11.5–15.5)
WBC: 6.8 10*3/uL (ref 4.0–10.5)
nRBC: 0 % (ref 0.0–0.2)

## 2021-12-29 MED ORDER — HYDROCODONE-ACETAMINOPHEN 5-325 MG PO TABS: 1.0000 | ORAL_TABLET | Freq: Four times a day (QID) | ORAL | 0 refills | Status: AC | PRN

## 2021-12-29 MED ORDER — HYDROCODONE-ACETAMINOPHEN 5-325 MG PO TABS: 1.0000 | ORAL_TABLET | ORAL | 0 refills | Status: DC | PRN

## 2021-12-29 MED ORDER — IBUPROFEN 800 MG PO TABS: 800.0000 mg | ORAL_TABLET | Freq: Three times a day (TID) | ORAL | 0 refills | Status: DC | PRN

## 2021-12-29 NOTE — Discharge Summary (Signed)
Physician Discharge Summary  Patient ID: Alexandria Rogers MRN: [id number] DOB/AGE: [birth year] 36 y.o.  Admit date: 12/28/2021 Discharge date: 12/29/2021  Admission Diagnoses:hemorrhagic CL cyst   Discharge Diagnoses:  Principal Problem:   Hemorrhagic cyst of right ovary   Discharged Condition: good  Hospital Course: pt was admitted for pain control and serial HCT . Pain dissipated and hct stabilized .  Neg CTA  r/o PE for SOB . No SOB on d/c  Consults: None  Significant Diagnostic Studies: labs:  Results for orders placed or performed during the hospital encounter of 12/28/21 (from the past 72 hour(s))  Troponin I (High Sensitivity)     Status: None   Collection Time: 12/28/21  1:13 AM  Result Value Ref Range   Troponin I (High Sensitivity) <2 <18 ng/L    Comment: (NOTE) Elevated high sensitivity troponin I (hsTnI) values and significant  changes across serial measurements may suggest ACS but many other  chronic and acute conditions are known to elevate hsTnI results.  Refer to the "Links" section for chest pain algorithms and additional  guidance. Performed at [facility], [address] [place], [place] [address]   Urinalysis, Routine w reflex microscopic Urine, Clean Catch     Status: Abnormal   Collection Time: 12/28/21  1:13 AM  Result Value Ref Range   Color, Urine YELLOW YELLOW   APPearance CLEAR CLEAR   Specific Gravity, Urine 1.025 1.005 - 1.030   pH 6.0 5.0 - 8.0   Glucose, UA NEGATIVE NEGATIVE mg/dL   Hgb urine dipstick NEGATIVE NEGATIVE   Bilirubin Urine NEGATIVE NEGATIVE   Ketones, ur 15 (A) NEGATIVE mg/dL   Protein, ur NEGATIVE NEGATIVE mg/dL   Nitrite NEGATIVE NEGATIVE   Leukocytes,Ua NEGATIVE NEGATIVE    Comment: Microscopic not done on urines with negative protein, blood, leukocytes, nitrite, or glucose < 500 mg/dL. Performed at [facility], [address] [place], [place] [address]   CBC with Differential/Platelet      Status: Abnormal   Collection Time: 12/28/21  1:13 AM  Result Value Ref Range   WBC 15.6 (H) 4.0 - 10.5 K/uL   RBC 4.14 3.87 - 5.11 MIL/uL   Hemoglobin 12.8 12.0 - 15.0 g/dL   HCT 38.2 36.0 - 46.0 %   MCV 92.3 80.0 - 100.0 fL   MCH 30.9 26.0 - 34.0 pg   MCHC 33.5 30.0 - 36.0 g/dL   RDW 12.2 11.5 - 15.5 %   Platelets 288 150 - 400 K/uL   nRBC 0.0 0.0 - 0.2 %   Neutrophils Relative % 87 %   Neutro Abs 13.4 (H) 1.7 - 7.7 K/uL   Lymphocytes Relative 9 %   Lymphs Abs 1.4 0.7 - 4.0 K/uL   Monocytes Relative 4 %   Monocytes Absolute 0.7 0.1 - 1.0 K/uL   Eosinophils Relative 0 %   Eosinophils Absolute 0.0 0.0 - 0.5 K/uL   Basophils Relative 0 %   Basophils Absolute 0.0 0.0 - 0.1 K/uL   Immature Granulocytes 0 %   Abs Immature Granulocytes 0.07 0.00 - 0.07 K/uL    Comment: Performed at [facility], [address] [place], [place] [address]  Comprehensive metabolic panel     Status: Abnormal   Collection Time: 12/28/21  1:13 AM  Result Value Ref Range   Sodium 135 135 - 145 mmol/L   Potassium 4.1 3.5 - 5.1 mmol/L   Chloride 105 98 - 111 mmol/L   CO2 21 (L) 22 -  32 mmol/L   Glucose, Bld 154 (H) 70 - 99 mg/dL    Comment: Glucose reference range applies only to samples taken after fasting for at least 8 hours.   BUN 17 6 - 20 mg/dL   Creatinine, Ser 1.610.84 0.44 - 1.00 mg/dL   Calcium 9.6 8.9 - 09.610.3 mg/dL   Total Protein 8.0 6.5 - 8.1 g/dL   Albumin 4.2 3.5 - 5.0 g/dL   AST 22 15 - 41 U/L   ALT 16 0 - 44 U/L   Alkaline Phosphatase 52 38 - 126 U/L   Total Bilirubin 0.8 0.3 - 1.2 mg/dL   GFR, Estimated >04>60 >54>60 mL/min    Comment: (NOTE) Calculated using the CKD-EPI Creatinine Equation (2021)    Anion gap 9 5 - 15    Comment: Performed at [facility], [address] [place], [place] [address]  Magnesium     Status: None   Collection Time: 12/28/21  1:13 AM  Result Value Ref Range   Magnesium 1.8 1.7 - 2.4 mg/dL    Comment: Performed at [facility],  [address] [place], [place] [address]  D-dimer, quantitative     Status: Abnormal   Collection Time: 12/28/21  1:13 AM  Result Value Ref Range   D-Dimer, Quant 0.59 (H) 0.00 - 0.50 ug/mL-FEU    Comment: (NOTE) At the manufacturer cut-off value of 0.5 g/mL FEU, this assay has a negative predictive value of 95-100%.This assay is intended for use in conjunction with a clinical pretest probability (PTP) assessment model to exclude pulmonary embolism (PE) and deep venous thrombosis (DVT) in outpatients suspected of PE or DVT. Results should be correlated with clinical presentation. Performed at [facility], [address] [place], [place] [address]   Lipase, blood     Status: None   Collection Time: 12/28/21  1:13 AM  Result Value Ref Range   Lipase 27 11 - 51 U/L    Comment: Performed at [facility], [address] [place], [place] [address]  Protime-INR     Status: None   Collection Time: 12/28/21  1:13 AM  Result Value Ref Range   Prothrombin Time 13.6 11.4 - 15.2 seconds   INR 1.1 0.8 - 1.2    Comment: (NOTE) INR goal varies based on device and disease states. Performed at [facility], [address] [place], [place] [address]   POC Urine Pregnancy, ED     Status: None   Collection Time: 12/28/21  2:40 AM  Result Value Ref Range   Preg Test, Ur NEGATIVE NEGATIVE    Comment:        THE SENSITIVITY OF THIS METHODOLOGY IS >24 mIU/mL   Troponin I (High Sensitivity)     Status: None   Collection Time: 12/28/21  3:31 AM  Result Value Ref Range   Troponin I (High Sensitivity) <2 <18 ng/L    Comment: (NOTE) Elevated high sensitivity troponin I (hsTnI) values and significant  changes across serial measurements may suggest ACS but many other  chronic and acute conditions are known to elevate hsTnI results.  Refer to the "Links" section for chest pain algorithms and additional  guidance. Performed at [facility], [address] [place], [place] [address]   Hemoglobin and hematocrit, blood     Status: Abnormal   Collection Time: 12/28/21  5:33 AM  Result Value Ref Range   Hemoglobin 11.4 (L) 12.0 - 15.0 g/dL   HCT 28.433.4 (L)  36.0 - 46.0 %    Comment: Performed at [facility], [address] [place], [place] [address]  Hemoglobin and hematocrit, blood     Status: Abnormal   Collection Time: 12/28/21  9:58 AM  Result Value Ref Range   Hemoglobin 10.7 (L) 12.0 - 15.0 g/dL   HCT 38.1 (L) 82.9 - 93.7 %    Comment: Performed at [facility], [address] [place], [place] [address]  CBC     Status: Abnormal   Collection Time: 12/28/21  3:51 PM  Result Value Ref Range   WBC 9.7 4.0 - 10.5 K/uL   RBC 3.29 (L) 3.87 - 5.11 MIL/uL   Hemoglobin 10.2 (L) 12.0 - 15.0 g/dL   HCT 89.3 (L) 81.0 - 17.5 %   MCV 93.0 80.0 - 100.0 fL   MCH 31.0 26.0 - 34.0 pg   MCHC 33.3 30.0 - 36.0 g/dL   RDW 10.2 58.5 - 27.7 %   Platelets 217 150 - 400 K/uL   nRBC 0.0 0.0 - 0.2 %    Comment: Performed at [facility], [address] [place], [place] [address]  CBC     Status: Abnormal   Collection Time: 12/29/21  6:59 AM  Result Value Ref Range   WBC 6.8 4.0 - 10.5 K/uL   RBC 3.11 (L) 3.87 - 5.11 MIL/uL   Hemoglobin 9.6 (L) 12.0 - 15.0 g/dL   HCT 53.6 (L) 14.4 - 31.5 %   MCV 93.6 80.0 - 100.0 fL   MCH 30.9 26.0 - 34.0 pg   MCHC 33.0 30.0 - 36.0 g/dL   RDW 40.0 86.7 - 61.9 %   Platelets 207 150 - 400 K/uL   nRBC 0.0 0.0 - 0.2 %    Comment: Performed at [facility], [address] [place], [place] [address]  Basic metabolic panel     Status: Abnormal   Collection Time: 12/29/21  6:59 AM  Result Value Ref Range   Sodium 138 135 - 145 mmol/L   Potassium 3.8 3.5 - 5.1 mmol/L   Chloride 108 98 - 111 mmol/L   CO2 26 22 - 32 mmol/L   Glucose, Bld 92 70 - 99 mg/dL    Comment: Glucose reference range applies only to samples taken after fasting for at least 8 hours.   BUN 12 6 - 20 mg/dL    Creatinine, Ser 6.71 0.44 - 1.00 mg/dL   Calcium 8.7 (L) 8.9 - 10.3 mg/dL   GFR, Estimated >24 >58 mL/min    Comment: (NOTE) Calculated using the CKD-EPI Creatinine Equation (2021)    Anion gap 4 (L) 5 - 15    Comment: Performed at [facility], [address] [place], [place] [address]     Treatments: IV hydration, analgesia: Morphine, and observation  Discharge Exam: Blood pressure (!) 102/59, pulse 68, temperature 98.5 F (36.9 C), temperature source Oral, resp. rate 18, height 5\' 7"  (1.702 m), weight 126.8 kg, SpO2 98 %, unknown if currently breastfeeding. Resp: clear to auscultation bilaterally Cardio: regular rate and rhythm, S1, S2 normal, no murmur, click, rub or gallop GI: soft , non distended , mild TTP   Disposition: Discharge disposition: 01-Home or Self Care       Discharge Instructions     Call MD for:   Complete by: As directed    Heavy vaginal bleeding   Call MD for:  difficulty breathing, headache or visual disturbances   Complete by: As directed  Call MD for:  extreme fatigue   Complete by: As directed    Call MD for:  hives   Complete by: As directed    Call MD for:  persistant dizziness or light-headedness   Complete by: As directed    Call MD for:  persistant nausea and vomiting   Complete by: As directed    Call MD for:  redness, tenderness, or signs of infection (pain, swelling, redness, odor or green/yellow discharge around incision site)   Complete by: As directed    Call MD for:  severe uncontrolled pain   Complete by: As directed    Call MD for:  temperature >100.4   Complete by: As directed    Diet - low sodium heart healthy   Complete by: As directed    Increase activity slowly   Complete by: As directed       Allergies as of 12/29/2021   No Known Allergies      Medication List     STOP taking these medications    gabapentin 100 MG capsule Commonly known as: NEURONTIN   meloxicam 15 MG tablet Commonly  known as: MOBIC   tiZANidine 4 MG tablet Commonly known as: ZANAFLEX       TAKE these medications    albuterol 108 (90 Base) MCG/ACT inhaler Commonly known as: VENTOLIN HFA Inhale 2 puffs into the lungs every 6 (six) hours as needed for wheezing.   HYDROcodone-acetaminophen 5-325 MG tablet Commonly known as: NORCO/VICODIN Take 1 tablet by mouth every 6 (six) hours as needed for up to 3 days for moderate pain.   HYDROcodone-acetaminophen 5-325 MG tablet Commonly known as: NORCO/VICODIN Take 1 tablet by mouth every 4 (four) hours as needed for moderate pain.   ibuprofen 800 MG tablet Commonly known as: ADVIL Take 1 tablet (800 mg total) by mouth every 8 (eight) hours as needed.   Lo Loestrin Fe 1 MG-10 MCG / 10 MCG tablet Generic drug: Norethindrone-Ethinyl Estradiol-Fe Biphas Take 1 tablet by mouth daily.   propranolol 20 MG tablet Commonly known as: INDERAL Take 20 mg by mouth daily.        Follow-up Information     Fresno Bing, MD Follow up in 1 week(s).   Specialty: Obstetrics and Gynecology Contact information: [address] [place] [address] [phone]                 Signed: Ihor Austin Annelise Mccoy 12/29/2021, 10:53 AM

## 2022-01-01 ENCOUNTER — Encounter: Payer: Self-pay | Admitting: Obstetrics and Gynecology

## 2022-01-01 ENCOUNTER — Telehealth: Payer: Self-pay

## 2022-01-01 NOTE — Progress Notes (Unsigned)
GYNECOLOGY OFFICE VISIT NOTE  History:   Alexandria Rogers is a 36 y.o. [phone] here today for follow up from ED for hemorrhagic cyst. It was likely ruptured based on the [place] on 9/21. She had a 2.5 cm complex cyst remaining.   She is not on anything for birth control.    She had gone into the ED for acute onset RLQ pain. She also had a CT which was negative. Her HgB was 9.6 (dropped from 11.4 c/w rupture). Her BMP was normal.     Past Medical History:  Diagnosis Date   Asthma    Exercise-induced asthma 12/24/2012    Past Surgical History:  Procedure Laterality Date   CESAREAN SECTION     CESAREAN SECTION  11/11/2020   Procedure: CESAREAN SECTION;  Surgeon: Warden Fillers, MD;  Location: [facility] LD [facility]S;  Service: Obstetrics;;   WISDOM TOOTH EXTRACTION      The following portions of the patient's history were reviewed and updated as appropriate: allergies, current medications, past family history, past medical history, past social history, past surgical history and problem list.   Health Maintenance:   Normal pap and negative HRHPV Diagnosis  Date Value Ref Range Status  07/19/2021   Final   - Negative for intraepithelial lesion or malignancy (NILM)    Review of Systems:  Pertinent items noted in HPI and remainder of comprehensive ROS otherwise negative.  Physical Exam:  There were no vitals taken for this visit. CONSTITUTIONAL: Well-developed, well-nourished female in no acute distress.  HEENT:  Normocephalic, atraumatic. External right and left ear normal. No scleral icterus.  NECK: Normal range of motion, supple, no masses noted on observation SKIN: No rash noted. Not diaphoretic. No erythema. No pallor. MUSCULOSKELETAL: Normal range of motion. No edema noted. NEUROLOGIC: Alert and oriented to person, place, and time. Normal muscle tone coordination. No cranial nerve deficit noted. PSYCHIATRIC: Normal mood and affect. Normal behavior. Normal judgment and thought content.     PELVIC: Deferred  Labs and Imaging Results for orders placed or performed during the hospital encounter of 12/28/21 (from the past 168 hour(s))  Urinalysis, Routine w reflex microscopic Urine, Clean Catch   Collection Time: 12/28/21  1:13 AM  Result Value Ref Range   Color, Urine YELLOW YELLOW   APPearance CLEAR CLEAR   Specific Gravity, Urine 1.025 1.005 - 1.030   pH 6.0 5.0 - 8.0   Glucose, UA NEGATIVE NEGATIVE mg/dL   Hgb urine dipstick NEGATIVE NEGATIVE   Bilirubin Urine NEGATIVE NEGATIVE   Ketones, ur 15 (A) NEGATIVE mg/dL   Protein, ur NEGATIVE NEGATIVE mg/dL   Nitrite NEGATIVE NEGATIVE   Leukocytes,Ua NEGATIVE NEGATIVE  CBC with Differential/Platelet   Collection Time: 12/28/21  1:13 AM  Result Value Ref Range   WBC 15.6 (H) 4.0 - 10.5 K/uL   RBC 4.14 3.87 - 5.11 MIL/uL   Hemoglobin 12.8 12.0 - 15.0 g/dL   HCT 32.9 92.4 - 26.8 %   MCV 92.3 80.0 - 100.0 fL   MCH 30.9 26.0 - 34.0 pg   MCHC 33.5 30.0 - 36.0 g/dL   RDW 34.1 96.2 - 22.9 %   Platelets 288 150 - 400 K/uL   nRBC 0.0 0.0 - 0.2 %   Neutrophils Relative % 87 %   Neutro Abs 13.4 (H) 1.7 - 7.7 K/uL   Lymphocytes Relative 9 %   Lymphs Abs 1.4 0.7 - 4.0 K/uL   Monocytes Relative 4 %   Monocytes Absolute 0.7 0.1 -  1.0 K/uL   Eosinophils Relative 0 %   Eosinophils Absolute 0.0 0.0 - 0.5 K/uL   Basophils Relative 0 %   Basophils Absolute 0.0 0.0 - 0.1 K/uL   Immature Granulocytes 0 %   Abs Immature Granulocytes 0.07 0.00 - 0.07 K/uL  Comprehensive metabolic panel   Collection Time: 12/28/21  1:13 AM  Result Value Ref Range   Sodium 135 135 - 145 mmol/L   Potassium 4.1 3.5 - 5.1 mmol/L   Chloride 105 98 - 111 mmol/L   CO2 21 (L) 22 - 32 mmol/L   Glucose, Bld 154 (H) 70 - 99 mg/dL   BUN 17 6 - 20 mg/dL   Creatinine, Ser 6.22 0.44 - 1.00 mg/dL   Calcium 9.6 8.9 - 29.7 mg/dL   Total Protein 8.0 6.5 - 8.1 g/dL   Albumin 4.2 3.5 - 5.0 g/dL   AST 22 15 - 41 U/L   ALT 16 0 - 44 U/L   Alkaline Phosphatase  52 38 - 126 U/L   Total Bilirubin 0.8 0.3 - 1.2 mg/dL   GFR, Estimated >98 >92 mL/min   Anion gap 9 5 - 15  Magnesium   Collection Time: 12/28/21  1:13 AM  Result Value Ref Range   Magnesium 1.8 1.7 - 2.4 mg/dL  D-dimer, quantitative   Collection Time: 12/28/21  1:13 AM  Result Value Ref Range   D-Dimer, Quant 0.59 (H) 0.00 - 0.50 ug/mL-FEU  Lipase, blood   Collection Time: 12/28/21  1:13 AM  Result Value Ref Range   Lipase 27 11 - 51 U/L  Protime-INR   Collection Time: 12/28/21  1:13 AM  Result Value Ref Range   Prothrombin Time 13.6 11.4 - 15.2 seconds   INR 1.1 0.8 - 1.2  Troponin I (High Sensitivity)   Collection Time: 12/28/21  1:13 AM  Result Value Ref Range   Troponin I (High Sensitivity) <2 <18 ng/L  POC Urine Pregnancy, ED   Collection Time: 12/28/21  2:40 AM  Result Value Ref Range   Preg Test, Ur NEGATIVE NEGATIVE  Troponin I (High Sensitivity)   Collection Time: 12/28/21  3:31 AM  Result Value Ref Range   Troponin I (High Sensitivity) <2 <18 ng/L  Hemoglobin and hematocrit, blood   Collection Time: 12/28/21  5:33 AM  Result Value Ref Range   Hemoglobin 11.4 (L) 12.0 - 15.0 g/dL   HCT 11.9 (L) 41.7 - 40.8 %  Hemoglobin and hematocrit, blood   Collection Time: 12/28/21  9:58 AM  Result Value Ref Range   Hemoglobin 10.7 (L) 12.0 - 15.0 g/dL   HCT 14.4 (L) 81.8 - 56.3 %  CBC   Collection Time: 12/28/21  3:51 PM  Result Value Ref Range   WBC 9.7 4.0 - 10.5 K/uL   RBC 3.29 (L) 3.87 - 5.11 MIL/uL   Hemoglobin 10.2 (L) 12.0 - 15.0 g/dL   HCT 14.9 (L) 70.2 - 63.7 %   MCV 93.0 80.0 - 100.0 fL   MCH 31.0 26.0 - 34.0 pg   MCHC 33.3 30.0 - 36.0 g/dL   RDW 85.8 85.0 - 27.7 %   Platelets 217 150 - 400 K/uL   nRBC 0.0 0.0 - 0.2 %  CBC   Collection Time: 12/29/21  6:59 AM  Result Value Ref Range   WBC 6.8 4.0 - 10.5 K/uL   RBC 3.11 (L) 3.87 - 5.11 MIL/uL   Hemoglobin 9.6 (L) 12.0 - 15.0 g/dL   HCT 41.2 (L) 87.8 -  46.0 %   MCV 93.6 80.0 - 100.0 fL   MCH 30.9  26.0 - 34.0 pg   MCHC 33.0 30.0 - 36.0 g/dL   RDW 12.3 11.5 - 15.5 %   Platelets 207 150 - 400 K/uL   nRBC 0.0 0.0 - 0.2 %  Basic metabolic panel   Collection Time: 12/29/21  6:59 AM  Result Value Ref Range   Sodium 138 135 - 145 mmol/L   Potassium 3.8 3.5 - 5.1 mmol/L   Chloride 108 98 - 111 mmol/L   CO2 26 22 - 32 mmol/L   Glucose, Bld 92 70 - 99 mg/dL   BUN 12 6 - 20 mg/dL   Creatinine, Ser 0.61 0.44 - 1.00 mg/dL   Calcium 8.7 (L) 8.9 - 10.3 mg/dL   GFR, Estimated >60 >60 mL/min   Anion gap 4 (L) 5 - 15   US PELVIC COMPLETE W TRANSVAGINAL AND TORSION R/O  Result Date: 12/28/2021 CLINICAL DATA:  Initial evaluation for acute right lower quadrant pain. EXAM: TRANSABDOMINAL AND TRANSVAGINAL ULTRASOUND OF PELVIS DOPPLER ULTRASOUND OF OVARIES TECHNIQUE: Both transabdominal and transvaginal ultrasound examinations of the pelvis were performed. Transabdominal technique was performed for global imaging of the pelvis including uterus, ovaries, adnexal regions, and pelvic cul-de-sac. It was necessary to proceed with endovaginal exam following the transabdominal exam to visualize the endometrium. Color and duplex Doppler ultrasound was utilized to evaluate blood flow to the ovaries. COMPARISON:  CT from earlier the same day. FINDINGS: Uterus Measurements: 11.7 x 5.0 x 7.3 cm = volume: 226.5 mL. No fibroids or other mass visualized. Endometrium Thickness: 6.6 mm.  No focal abnormality visualized. Right ovary Measurements: 5.8 x 4.1 x 4.8 cm = volume: 60.3 mL. 2.5 x 2.5 x 2.3 cm complex cyst seen within the right ovary. Eccentric internal echogenicity favored to reflect blood products/retractile clot. No internal vascularity or solid nodularity. Left ovary Measurements: 3.2 x 1.5 x 3.0 cm = volume: 7.8 mL. Normal appearance/no adnexal mass. Pulsed Doppler evaluation of both ovaries demonstrates normal low-resistance arterial and venous waveforms. Other findings Moderate volume complex fluid seen  surrounding the right ovary, likely reflecting hemorrhage/blood products. IMPRESSION: 1. 2.5 cm complex right ovarian cyst, likely a hemorrhagic cyst with internal retractile clot. Associated moderate volume complex free fluid surrounding the right ovary, likely reflecting hemorrhage/blood products, and suggestive of recent cyst rupture. 2. No evidence for ovarian torsion. Electronically Signed   By: Jeannine Boga M.D.   On: 12/28/2021 05:01   CT ABDOMEN PELVIS W CONTRAST  Result Date: 12/28/2021 CLINICAL DATA:  Syncopal episode with lower abdominal pain. EXAM: CT ABDOMEN AND PELVIS WITH CONTRAST TECHNIQUE: Multidetector CT imaging of the abdomen and pelvis was performed using the standard protocol following bolus administration of intravenous contrast. RADIATION DOSE REDUCTION: This exam was performed according to the departmental dose-optimization program which includes automated exposure control, adjustment of the mA and/or kV according to patient size and/or use of iterative reconstruction technique. CONTRAST:  115mL OMNIPAQUE IOHEXOL 350 MG/ML SOLN COMPARISON:  October 30, 2009 FINDINGS: Lower chest: No acute abnormality. Hepatobiliary: No focal liver abnormality is seen. No gallstones, gallbladder wall thickening, or biliary dilatation. Pancreas: Unremarkable. No pancreatic ductal dilatation or surrounding inflammatory changes. Spleen: Normal in size without focal abnormality. Adrenals/Urinary Tract: Adrenal glands are unremarkable. Kidneys are normal, without renal calculi, focal lesion, or hydronephrosis. Bladder is unremarkable. Stomach/Bowel: Stomach is within normal limits. Appendix appears normal. No evidence of bowel wall thickening, distention, or inflammatory changes. Vascular/Lymphatic: No significant  vascular findings are present. No enlarged abdominal or pelvic lymph nodes. Reproductive: The uterus and left adnexa are unremarkable. The IUD seen within the uterus on the prior study is no  longer present. A 2.5 cm by 2.5 cm right ovarian cyst is seen, with a tubular appearing fluid filled structure noted along the anterior aspect of the right adnexa. A mild to moderate amount of surrounding hyperdense peri adnexal blood is present (approximately 70.25 Hounsfield units). Other: There is a small, stable fat containing umbilical hernia. A small amount of hemorrhagic fluid is noted within the posterior aspect of the pelvis. A mild-to-moderate amount of nonhemorrhagic perihepatic, perisplenic and right paracolic gutter fluid is noted. Musculoskeletal: IMPRESSION: 1. Findings which may represent a ruptured right ovarian cyst, with a mild-to-moderate amount of associated acute right peri adnexal blood. Further evaluation is recommended to exclude sequelae associated with a ruptured ectopic pregnancy 2. Right ovarian cyst and suspected dilated fallopian tubes. Further evaluation with pelvic ultrasound is recommended. 3. Mild to moderate amount of nonhemorrhagic abdominal free fluid. Electronically Signed   By: Aram Candela M.D.   On: 12/28/2021 03:35   CT Angio Chest PE W and/or Wo Contrast  Result Date: 12/28/2021 CLINICAL DATA:  Syncopal episode with lower abdominal pain. EXAM: CT ANGIOGRAPHY CHEST WITH CONTRAST TECHNIQUE: Multidetector CT imaging of the chest was performed using the standard protocol during bolus administration of intravenous contrast. Multiplanar CT image reconstructions and MIPs were obtained to evaluate the vascular anatomy. RADIATION DOSE REDUCTION: This exam was performed according to the departmental dose-optimization program which includes automated exposure control, adjustment of the mA and/or kV according to patient size and/or use of iterative reconstruction technique. CONTRAST:  OMNIPAQUE IOHEXOL 350 MG/ML SOLN COMPARISON:  None Available. FINDINGS: Cardiovascular: A thoracic aorta is normal appearance. Satisfactory opacification of the pulmonary arteries to the  segmental level. No evidence of pulmonary embolism. Normal heart size. No pericardial effusion. Mediastinum/Nodes: No enlarged mediastinal, hilar, or axillary lymph nodes. Thyroid gland, trachea, and esophagus demonstrate no significant findings. Lungs/Pleura: Lungs are clear. No pleural effusion or pneumothorax. Upper Abdomen: There is a mild-to-moderate amount of perihepatic and perisplenic fluid. Musculoskeletal: No chest wall abnormality. No acute or significant osseous findings. Review of the MIP images confirms the above findings. IMPRESSION: 1. No evidence of pulmonary embolism or other acute intrathoracic pathology. 2. Mild-to-moderate amount of perihepatic and perisplenic fluid. Electronically Signed   By: Aram Candela M.D.   On: 12/28/2021 03:24    Assessment and Plan:   1. Hemorrhagic cyst of right ovary Clinically and by [place] consistent with ruptured  hemorrhagic cyst. Can recheck an [place] in 6 weeks but since ruptured I would not require this. She would like *** Discussed unlikely to occur again, but could do OCPs to try to prevent recurrence. She ***    Routine preventative health maintenance measures emphasized. Please refer to After Visit Summary for other counseling recommendations.   No follow-ups on file.  Milas Hock, MD, FACOG Obstetrician & Gynecologist, [facility], [facility]

## 2022-01-01 NOTE — Telephone Encounter (Signed)
Pt called in to schedule a follow up visit from the ER with Dr. Ilda Basset. Provided pt with Dr. Ilda Basset first available as well as the first available with any provider. Pt will send in mychart message to Dr. Ilda Basset

## 2022-01-02 ENCOUNTER — Ambulatory Visit (INDEPENDENT_AMBULATORY_CARE_PROVIDER_SITE_OTHER): Payer: 59 | Admitting: Obstetrics and Gynecology

## 2022-01-02 ENCOUNTER — Encounter: Payer: Self-pay | Admitting: Obstetrics and Gynecology

## 2022-01-02 VITALS — BP 112/77 | HR 64

## 2022-01-02 DIAGNOSIS — N83201 Unspecified ovarian cyst, right side: Secondary | ICD-10-CM | POA: Diagnosis not present

## 2022-01-02 MED ORDER — ACETAMINOPHEN 500 MG PO TABS: 500.0000 mg | ORAL_TABLET | Freq: Three times a day (TID) | ORAL | 1 refills | Status: DC | PRN

## 2022-01-02 MED ORDER — HYDROCODONE-ACETAMINOPHEN 5-325 MG PO TABS: 1.0000 | ORAL_TABLET | ORAL | 0 refills | Status: DC | PRN

## 2022-01-02 MED ORDER — LO LOESTRIN FE 1 MG-10 MCG / 10 MCG PO TABS: 1.0000 | ORAL_TABLET | Freq: Every day | ORAL | 3 refills | Status: AC

## 2022-01-02 MED ORDER — IBUPROFEN 600 MG PO TABS: 600.0000 mg | ORAL_TABLET | Freq: Three times a day (TID) | ORAL | 3 refills | Status: DC

## 2022-07-20 ENCOUNTER — Other Ambulatory Visit (HOSPITAL_COMMUNITY)
Admission: RE | Admit: 2022-07-20 | Discharge: 2022-07-20 | Disposition: A | Discharge status: Home / Self Care | Payer: 59 | Source: Ambulatory Visit | Attending: Obstetrics and Gynecology | Admitting: Obstetrics and Gynecology

## 2022-07-20 ENCOUNTER — Ambulatory Visit (INDEPENDENT_AMBULATORY_CARE_PROVIDER_SITE_OTHER): Payer: 59 | Admitting: Obstetrics and Gynecology

## 2022-07-20 ENCOUNTER — Encounter: Payer: Self-pay | Admitting: Obstetrics and Gynecology

## 2022-07-20 VITALS — BP 122/80 | HR 62 | Ht 67.0 in | Wt 265.0 lb

## 2022-07-20 DIAGNOSIS — Z113 Encounter for screening for infections with a predominantly sexual mode of transmission: Secondary | ICD-10-CM | POA: Insufficient documentation

## 2022-07-20 DIAGNOSIS — Z8742 Personal history of other diseases of the female genital tract: Secondary | ICD-10-CM

## 2022-07-20 DIAGNOSIS — Z1339 Encounter for screening examination for other mental health and behavioral disorders: Secondary | ICD-10-CM | POA: Diagnosis not present

## 2022-07-20 DIAGNOSIS — Z01419 Encounter for gynecological examination (general) (routine) without abnormal findings: Secondary | ICD-10-CM

## 2022-07-20 NOTE — Progress Notes (Signed)
Patient presents for Annual.  LMP: 06/25/22 Last pap:  07/19/21 WNL  Contraception: OCP Mammogram: Not yet indicated STD Screening: Accepts Flu Vaccine : N/A  CC:  pt still notes random pain before period. And discomfort still after cycle has ended.  Fun Fact: Patient enjoys cooking.

## 2022-07-20 NOTE — Progress Notes (Unsigned)
Obstetrics and Gynecology Annual Patient Evaluation  Appointment Date: 07/20/2022  OBGYN Clinic: [facility]  Primary Care Provider: [facility]  Referring Provider: [facility]  Chief Complaint:  Chief Complaint  Patient presents with   Gynecologic Exam    History of Present Illness: Alexandria Rogers is a 37 y.o. [phone] (Patient's last menstrual period was 06/25/2022 (approximate).), seen for the above chief complaint. Her past medical history is significant for BMI 40s, pelvic adhesive disease, c-section x 2, h/o hemorrhagic ovarian cyst  On Lo loestrin, periods are qmonth, regular, lasts 4-5 days and heavy for first day with pain a few days before and after period ends. Patient admitted for observation for hemorrhagic cyst in Sept 2023 and was put on pills after that; she had not started the lo loestrin prior to that  Review of Systems: Pertinent items noted in HPI and remainder of comprehensive ROS otherwise negative.    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   BMI 40.0-44.9, adult 07/19/2021    Past Medical History:  Past Medical History:  Diagnosis Date   Asthma    Exercise-induced asthma 12/24/2012    Past Surgical History:  Past Surgical History:  Procedure Laterality Date   CESAREAN SECTION     CESAREAN SECTION  11/11/2020   Procedure: CESAREAN SECTION;  Surgeon: Warden Fillers, MD;  Location: [facility] LD ORS;  Service: Obstetrics;;   WISDOM TOOTH EXTRACTION      Past Obstetrical History:  OB History  Gravida Para Term Preterm AB Living  3 2 2   1 2   SAB IAB Ectopic Multiple Live Births    1   0 2    # Outcome Date GA Lbr Len/2nd Weight Sex Delivery Anes PTL Lv  3 IAB 02/2021          2 Term 11/11/20 [redacted]w[redacted]d  7 lb 1.9 oz (3.23 kg) F CS-LTranv EPI  LIV  1 Term 08/02/06  [redacted]w[redacted]d  9 lb 5 oz (4.224 kg) M CS-LTranv EPI  LIV    Past Gynecological History: As per HPI. History of Pap Smear(s): Yes.   Last pap 2023, which was negative cytology and HPV  Social History:  Social History   Socioeconomic History   Marital status: Single    Spouse name: Not on file   Number of children: Not on file   Years of education: Not on file   Highest education level: Not on file  Occupational History   Not on file  Tobacco Use   Smoking status: Never   Smokeless tobacco: Never  Vaping Use   Vaping Use: Never used  Substance and Sexual Activity   Alcohol use: Not Currently    Comment: once or twice a week   Drug use: No   Sexual activity: Yes    Partners: Male    Birth control/protection: None  Other Topics Concern   Not on file  Social History Narrative   Not on file   Social Determinants of Health   Financial Resource Strain: Not on file  Food Insecurity: No Food Insecurity (12/28/2021)   Hunger Vital Sign    Worried About Running Out of Food in the Last Year: Never true    Ran Out of Food in the Last Year: Never true  Transportation Needs: No Transportation Needs (12/28/2021)   PRAPARE -  Administrator, Civil Service (Medical): No    Lack of Transportation (Non-Medical): No  Physical Activity: Not on file  Stress: Not on file  Social Connections: Not on file  Intimate Partner Violence: Not on file    Family History:  Family History  Problem Relation Age of Onset   Heart disease Father    Alcohol abuse Father    Lung cancer Maternal Grandmother    Diabetes Paternal Grandmother     Medications Alexandria Rogers had no medications administered during this visit. Current Outpatient Medications  Medication Sig Dispense Refill   albuterol (PROVENTIL HFA;VENTOLIN HFA) 108 (90 BASE) MCG/ACT inhaler Inhale 2 puffs into the lungs every 6 (six) hours as needed for wheezing.     LO LOESTRIN FE 1 MG-10 MCG / 10 MCG tablet Take 1 tablet by  mouth daily. 90 tablet 3   propranolol (INDERAL) 20 MG tablet Take 20 mg by mouth daily.     No current facility-administered medications for this visit.    Allergies Patient has no known allergies.   Physical Exam:  BP 122/80   Pulse 62   Ht  (1.702 m)   Wt 265 lb (120.2 kg)   LMP 06/25/2022 (Approximate)   BMI 41.50 kg/m  Body mass index is 41.5 kg/m. 12 lbs less than last year! General appearance: Well nourished, well developed female in no acute distress.  Neck:  Supple, normal appearance, and no thyromegaly  Cardiovascular: normal s1 and s2.  No murmurs, rubs or gallops. Respiratory:  Clear to auscultation bilateral. Normal respiratory effort Abdomen: positive bowel sounds and no masses, hernias; diffusely non tender to palpation, non distended Breasts: breasts appear normal, no suspicious masses, no skin or nipple changes or axillary nodes. Neuro/Psych:  Normal mood and affect.  Skin:  Warm and dry.  Lymphatic:  No inguinal lymphadenopathy.   Cervical exam performed in the presence of a chaperone EGBUS: within normal limits Vagina: within normal limits and with no blood or discharge in the vault Cervix: very anterior consistent with pelvic adhesive disease.  normal appearing cervix without tenderness, discharge or lesions. Uterus:  nonenlarged and non tender Adnexa:  normal adnexa and no mass, fullness, tenderness Rectovaginal: deferred  Laboratory: none  Radiology: none  Assessment: patient stable  Plan: 1. Screen for STD (sexually transmitted disease) - RPR+HBsAg+HCVAb+... - Cervicovaginal ancillary only( [place])  2. History of ovarian cyst Recommend continuing on OCPs for birth control as well as to hopefully prevent any future cysts which patient is amenable to.   RTC 1 year and PRN  No follow-ups on file.  No future appointments.  Cornelia Copa MD Attending Center for [employer] [facility])

## 2022-07-21 LAB — RPR+HBSAG+HCVAB+...
HIV Screen 4th Generation wRfx: NONREACTIVE
Hep C Virus Ab: NONREACTIVE
Hepatitis B Surface Ag: NEGATIVE
RPR Ser Ql: NONREACTIVE

## 2022-07-24 ENCOUNTER — Encounter: Payer: Self-pay | Admitting: Obstetrics and Gynecology

## 2022-07-24 LAB — CERVICOVAGINAL ANCILLARY ONLY
Bacterial Vaginitis (gardnerella): POSITIVE — AB
Candida Glabrata: NEGATIVE
Candida Vaginitis: NEGATIVE
Chlamydia: NEGATIVE
Comment: NEGATIVE
Comment: NEGATIVE
Comment: NEGATIVE
Comment: NEGATIVE
Comment: NEGATIVE
Comment: NORMAL
Neisseria Gonorrhea: NEGATIVE
Trichomonas: NEGATIVE

## 2022-07-25 ENCOUNTER — Encounter: Payer: Self-pay | Admitting: Obstetrics and Gynecology

## 2022-07-25 ENCOUNTER — Other Ambulatory Visit: Payer: Self-pay | Admitting: *Deleted

## 2022-07-25 MED ORDER — METRONIDAZOLE 500 MG PO TABS: 500.0000 mg | ORAL_TABLET | Freq: Two times a day (BID) | ORAL | 0 refills | Status: AC

## 2022-09-13 ENCOUNTER — Emergency Department
Admission: EM | Admit: 2022-09-13 | Discharge: 2022-09-13 | Disposition: A | Discharge status: Home / Self Care | Payer: 59 | Attending: Emergency Medicine | Admitting: Emergency Medicine

## 2022-09-13 ENCOUNTER — Other Ambulatory Visit: Payer: Self-pay

## 2022-09-13 ENCOUNTER — Encounter: Payer: Self-pay | Admitting: *Deleted

## 2022-09-13 ENCOUNTER — Emergency Department: Payer: 59

## 2022-09-13 DIAGNOSIS — M7989 Other specified soft tissue disorders: Secondary | ICD-10-CM | POA: Diagnosis present

## 2022-09-13 DIAGNOSIS — J45909 Unspecified asthma, uncomplicated: Secondary | ICD-10-CM | POA: Insufficient documentation

## 2022-09-13 DIAGNOSIS — I8001 Phlebitis and thrombophlebitis of superficial vessels of right lower extremity: Secondary | ICD-10-CM | POA: Diagnosis not present

## 2022-09-13 NOTE — ED Provider Notes (Signed)
[facility] Provider Note    Event Date/Time   First MD Initiated Contact with Patient 09/13/22 1833     (approximate)  History   Chief Complaint: Leg Swelling  HPI  Alexandria Rogers is a 37 y.o. female with a past medical history of asthma who presents to the emergency department for right calf pain.  According to the patient she recently had a hemorrhagic cyst with internal hemorrhage per patient was placed on estrogen containing birth control to help regulate her cycle.  Patient states over the last couple days she has noticed pain and now redness and warmth to the posterior aspect of her right calf.  Denies any prior DVT.  No chest pain or shortness of breath.  Physical Exam   Triage Vital Signs: ED Triage Vitals  Enc Vitals Group     BP 09/13/22 1800 118/78     Pulse Rate 09/13/22 1800 (!) 56     Resp 09/13/22 1800 18     Temp 09/13/22 1800 98.8 F (37.1 C)     Temp Source 09/13/22 1800 Oral     SpO2 09/13/22 1800 97 %     Weight 09/13/22 1756 276 lb (125.2 kg)     Height 09/13/22 1756 5\' 7"  (1.702 m)     Head Circumference --      Peak Flow --      Pain Score 09/13/22 1756 7     Pain Loc --      Pain Edu? --      Excl. in GC? --     Most recent vital signs: Vitals:   09/13/22 1800  BP: 118/78  Pulse: (!) 56  Resp: 18  Temp: 98.8 F (37.1 C)  SpO2: 97%    General: Awake, no distress.  CV:  Good peripheral perfusion.   Resp:  Normal effort.  Abd:  No distention.   Other:  Patient does have warmth and mild tenderness to the posterior aspect of the right calf mild erythema noted area approximately 6 cm in diameter.   ED Results / Procedures / Treatments    RADIOLOGY  Ultrasound consistent with superficial clot no DVT.   MEDICATIONS ORDERED IN ED: Medications - No data to display   IMPRESSION / MDM / ASSESSMENT AND PLAN / ED COURSE  I reviewed the triage vital signs and the nursing notes.  Patient's presentation is  most consistent with acute presentation with potential threat to life or bodily function.  Patient presents to the emergency department for right calf pain where there is mild warmth and tenderness and erythema to the area.  Exam is most consistent with likely superficial thrombophlebitis.  Patient denies any history of prior clot denies any chest pain or shortness of breath.  Patient's DVT ultrasound is negative for DVT but does show superficial clot.  Highly suspect this is likely induced by the patient's recent start of estrogen containing birth control.  Discussed with the patient to stop her birth control and use barrier methods if sexually active, start baby aspirin once daily and use warm compresses to the area follow-up with her doctor in 2 weeks for repeat ultrasound, discussed return precautions for any worsening pain any trouble breathing or shortness of breath.  Patient agreeable to plan.  FINAL CLINICAL IMPRESSION(S) / ED DIAGNOSES   Superficial thrombophlebitis    Note:  This document was prepared using Dragon voice recognition software and may include unintentional dictation errors.   Minna Antis, MD 09/13/22  1911  

## 2022-09-13 NOTE — ED Notes (Signed)
Pt verbalizes understanding of discharge instructions. Opportunity for questioning and answers were provided. Pt discharged from ED to home.   ? ?

## 2022-09-13 NOTE — Discharge Instructions (Addendum)
As we discussed please begin taking a baby aspirin 81 mg each day.  Please do warm compresses to the area for 30 minutes every several hours to help increase blood flow to the area.  Please avoid bedrest.  Please follow-up with your doctor in 2 weeks for recheck/reevaluation as well as a repeat ultrasound to ensure the clot has not increased in size.  As we discussed please discontinue use of your birth control and use barrier methods for birth control if needed.

## 2022-09-13 NOTE — ED Triage Notes (Signed)
Pt has pain and swelling to right lower leg for 4 days.  Area red and painful to touch.  No known injury to leg.  Pt is on bcp's.  Nonsmoker  pt alert  speech clear.

## 2022-09-14 ENCOUNTER — Other Ambulatory Visit: Payer: Self-pay | Admitting: Obstetrics and Gynecology

## 2022-09-14 DIAGNOSIS — N83201 Unspecified ovarian cyst, right side: Secondary | ICD-10-CM

## 2022-10-03 ENCOUNTER — Other Ambulatory Visit: Payer: Self-pay | Admitting: Family Medicine

## 2022-10-03 DIAGNOSIS — I8001 Phlebitis and thrombophlebitis of superficial vessels of right lower extremity: Secondary | ICD-10-CM

## 2022-10-09 ENCOUNTER — Ambulatory Visit
Admission: RE | Admit: 2022-10-09 | Discharge: 2022-10-09 | Disposition: A | Discharge status: Home / Self Care | Payer: 59 | Source: Ambulatory Visit | Attending: Family Medicine | Admitting: Family Medicine

## 2022-10-09 DIAGNOSIS — I8001 Phlebitis and thrombophlebitis of superficial vessels of right lower extremity: Secondary | ICD-10-CM | POA: Diagnosis present

## 2023-10-23 ENCOUNTER — Other Ambulatory Visit (HOSPITAL_COMMUNITY)
Admission: RE | Admit: 2023-10-23 | Discharge: 2023-10-23 | Disposition: A | Discharge status: Home / Self Care | Source: Ambulatory Visit | Attending: Certified Nurse Midwife | Admitting: Certified Nurse Midwife

## 2023-10-23 ENCOUNTER — Ambulatory Visit: Admitting: Certified Nurse Midwife

## 2023-10-23 ENCOUNTER — Encounter: Payer: Self-pay | Admitting: Certified Nurse Midwife

## 2023-10-23 VITALS — BP 116/81 | HR 59 | Ht 67.0 in | Wt 261.0 lb

## 2023-10-23 DIAGNOSIS — Z01419 Encounter for gynecological examination (general) (routine) without abnormal findings: Secondary | ICD-10-CM | POA: Insufficient documentation

## 2023-10-23 DIAGNOSIS — N760 Acute vaginitis: Secondary | ICD-10-CM

## 2023-10-23 DIAGNOSIS — Z113 Encounter for screening for infections with a predominantly sexual mode of transmission: Secondary | ICD-10-CM

## 2023-10-23 DIAGNOSIS — Z3009 Encounter for other general counseling and advice on contraception: Secondary | ICD-10-CM | POA: Diagnosis not present

## 2023-10-23 DIAGNOSIS — B9689 Other specified bacterial agents as the cause of diseases classified elsewhere: Secondary | ICD-10-CM

## 2023-10-23 MED ORDER — ETONOGESTREL-ETHINYL ESTRADIOL 0.12-0.015 MG/24HR VA RING: VAGINAL_RING | VAGINAL | 12 refills | Status: AC

## 2023-10-23 NOTE — Progress Notes (Signed)
 Patient presents for Annual.  LMP: 10/03/2023 Monthly  Last pap: 07/19/2021 WNL Contraception: None Hx of blood clot while on pills  Mammogram: Not yet indicated STD Screening: Accepts   CC: Pain with periods.pt was told she has a lot of scar tissue from c-sections will take Ibp and use heating heating pads for relief.  Hx of BV would like swab today.

## 2023-10-23 NOTE — Addendum Note (Signed)
 Addended by: IVAN SHARENE HERO on: 10/23/2023 03:42 PM   Modules accepted: Orders

## 2023-10-23 NOTE — Progress Notes (Signed)
 GYNECOLOGY OFFICE VISIT NOTE  History:   Alexandria Prevette is a 38 y.o. [phone] here today for her annual. She reports heavy painful menstrual periods. She states that she is saturating pad every hour and bleeding through with pads and tampons. Patient reports was previously on OCPs and had to stop due to a superficial blood clot, but she reports that worked best for her and would like to discuss options. She also request STD testing and reports a thin grey vaginal discharge.      Past Medical History:  Diagnosis Date   Asthma    Exercise-induced asthma 12/24/2012   Hemorrhagic cyst of right ovary 12/28/2021    Past Surgical History:  Procedure Laterality Date   CESAREAN SECTION     CESAREAN SECTION  11/11/2020   Procedure: CESAREAN SECTION;  Surgeon: Zina Jerilynn LABOR, MD;  Location: [facility];  Service: Obstetrics;;   WISDOM TOOTH EXTRACTION      The following portions of the patient's history were reviewed and updated as appropriate: allergies, current medications, past family history, past medical history, past social history, past surgical history and problem list.   Health Maintenance:  Normal pap and negative HRHPV on 07/19/2021.  Mammogram not recommended at this time Review of Systems:  Pertinent items noted in HPI and remainder of comprehensive ROS otherwise negative.  Physical Exam:  BP 116/81   Pulse (!) 59   Ht 5' 7 (1.702 m)   Wt 261 lb (118.4 kg)   LMP 10/03/2023   BMI 40.88 kg/m  CONSTITUTIONAL: Well-developed, well-nourished female in no acute distress.  HEENT:  Normocephalic, atraumatic. External right and left ear normal. No scleral icterus.  NECK: Normal range of motion, supple, no masses noted on observation SKIN: No rash noted. Not diaphoretic. No erythema. No pallor. MUSCULOSKELETAL: Normal range of motion. No edema noted. NEUROLOGIC: Alert and oriented to person, place, and time. Normal muscle tone coordination. No cranial nerve deficit  noted. PSYCHIATRIC: Normal mood and affect. Normal behavior. Normal judgment and thought content. CARDIOVASCULAR: Normal heart rate noted RESPIRATORY: Effort and breath sounds normal, no problems with respiration noted ABDOMEN: No masses noted. No other overt distention noted.   PELVIC: Deferred patient preference. Self swab collected.   Labs and Imaging No results found for this or any previous visit (from the past week). No results found.    Assessment and Plan:    1. Encounter for annual routine gynecological examination (Primary) - Cervicovaginal ancillary only( [place]) - RPR+HBsAg+HCVAb+...  2. Screening examination for STD (sexually transmitted disease) - STD screening performed at pt request   3. Bacterial vaginosis - patient reports having BV previously. - Reviewed how BV occurs and recommended several hygiene regimes to decrease recurrence.   4. Birth control counseling - reviewed options and discussed alternatives based on hx of superficial clot. Reviewed Non hormonal IUD and other IUD usage. Discussed Progestin only pills, phexxi, depo and the nuvaring. Discussed that the ring is a combination and has risk associated with blood cots, but is relatively low in comparison to that of the pill. R/B/A of other options as discussed and patient desires to try the ring.  - Discussed a 3 month trial of the Ring and recommended to follow up .   Routine preventative health maintenance measures emphasized. Please refer to After Visit Summary for other counseling recommendations.   Return in about 3 months (around 01/23/2024) for Birth Control Follow up .    I spent 30 minutes dedicated to  the care of this patient including pre-visit review of records, face to face time with the patient discussing her conditions and treatments and post visit orders.    Jazmin Ley Erven) Emilio, MSN, CNM  [facility]  10/23/23 1:00 PM

## 2023-10-25 LAB — CERVICOVAGINAL ANCILLARY ONLY
Bacterial Vaginitis (gardnerella): NEGATIVE
Candida Glabrata: NEGATIVE
Candida Vaginitis: NEGATIVE
Chlamydia: NEGATIVE
Comment: NEGATIVE
Comment: NEGATIVE
Comment: NEGATIVE
Comment: NEGATIVE
Comment: NEGATIVE
Comment: NORMAL
Neisseria Gonorrhea: NEGATIVE
Trichomonas: NEGATIVE
